# Patient Record
Sex: Male | Born: 1990 | Hispanic: No | Marital: Single | Smoking: Light tobacco smoker
Health system: Southern US, Community
[De-identification: ages and names within clinical notes are randomized; demographics above are authoritative.]

## PROBLEM LIST (undated history)

## (undated) DIAGNOSIS — F419 Anxiety disorder, unspecified: Secondary | ICD-10-CM

## (undated) DIAGNOSIS — F1120 Opioid dependence, uncomplicated: Secondary | ICD-10-CM

## (undated) DIAGNOSIS — G4700 Insomnia, unspecified: Secondary | ICD-10-CM

---
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
## 2010-11-03 ENCOUNTER — Emergency Department (HOSPITAL_BASED_OUTPATIENT_CLINIC_OR_DEPARTMENT_OTHER)
Admission: EM | Admit: 2010-11-03 | Discharge: 2010-11-03 | Disposition: A | Discharge status: Home / Self Care | Payer: Worker's Compensation | Attending: Emergency Medicine | Admitting: Emergency Medicine

## 2010-11-03 DIAGNOSIS — W208XXA Other cause of strike by thrown, projected or falling object, initial encounter: Secondary | ICD-10-CM | POA: Insufficient documentation

## 2010-11-03 DIAGNOSIS — G8929 Other chronic pain: Secondary | ICD-10-CM | POA: Insufficient documentation

## 2010-11-03 DIAGNOSIS — M542 Cervicalgia: Secondary | ICD-10-CM | POA: Insufficient documentation

## 2010-11-03 DIAGNOSIS — R51 Headache: Secondary | ICD-10-CM | POA: Insufficient documentation

## 2010-11-03 DIAGNOSIS — F319 Bipolar disorder, unspecified: Secondary | ICD-10-CM | POA: Insufficient documentation

## 2010-11-03 DIAGNOSIS — S0003XA Contusion of scalp, initial encounter: Secondary | ICD-10-CM | POA: Insufficient documentation

## 2010-11-08 ENCOUNTER — Emergency Department (HOSPITAL_BASED_OUTPATIENT_CLINIC_OR_DEPARTMENT_OTHER)
Admission: EM | Admit: 2010-11-08 | Discharge: 2010-11-09 | Disposition: A | Discharge status: Home / Self Care | Payer: PRIVATE HEALTH INSURANCE | Attending: Emergency Medicine | Admitting: Emergency Medicine

## 2010-11-08 DIAGNOSIS — F410 Panic disorder [episodic paroxysmal anxiety] without agoraphobia: Secondary | ICD-10-CM | POA: Insufficient documentation

## 2010-11-08 DIAGNOSIS — R071 Chest pain on breathing: Secondary | ICD-10-CM | POA: Insufficient documentation

## 2010-11-08 DIAGNOSIS — F319 Bipolar disorder, unspecified: Secondary | ICD-10-CM | POA: Insufficient documentation

## 2010-11-08 DIAGNOSIS — Z79899 Other long term (current) drug therapy: Secondary | ICD-10-CM | POA: Insufficient documentation

## 2010-11-08 DIAGNOSIS — T50995A Adverse effect of other drugs, medicaments and biological substances, initial encounter: Secondary | ICD-10-CM | POA: Insufficient documentation

## 2010-11-08 DIAGNOSIS — G8929 Other chronic pain: Secondary | ICD-10-CM | POA: Insufficient documentation

## 2010-11-09 ENCOUNTER — Emergency Department (INDEPENDENT_AMBULATORY_CARE_PROVIDER_SITE_OTHER): Payer: PRIVATE HEALTH INSURANCE

## 2010-11-09 DIAGNOSIS — M545 Low back pain: Secondary | ICD-10-CM

## 2010-11-09 DIAGNOSIS — R079 Chest pain, unspecified: Secondary | ICD-10-CM

## 2010-11-09 DIAGNOSIS — R404 Transient alteration of awareness: Secondary | ICD-10-CM

## 2010-11-09 LAB — DIFFERENTIAL
Basophils Absolute: 0 10*3/uL (ref 0.0–0.1)
Basophils Relative: 1 % (ref 0–1)
Eosinophils Absolute: 0.1 10*3/uL (ref 0.0–0.7)
Eosinophils Relative: 1 % (ref 0–5)
Lymphocytes Relative: 47 % — ABNORMAL HIGH (ref 12–46)
Lymphs Abs: 3.1 10*3/uL (ref 0.7–4.0)
Monocytes Absolute: 0.7 10*3/uL (ref 0.1–1.0)
Monocytes Relative: 11 % (ref 3–12)
Neutro Abs: 2.6 10*3/uL (ref 1.7–7.7)
Neutrophils Relative %: 40 % — ABNORMAL LOW (ref 43–77)

## 2010-11-09 LAB — URINALYSIS, ROUTINE W REFLEX MICROSCOPIC
Glucose, UA: NEGATIVE mg/dL
Leukocytes, UA: NEGATIVE
Protein, ur: NEGATIVE mg/dL
Specific Gravity, Urine: 1.016 (ref 1.005–1.030)
Urobilinogen, UA: 0.2 mg/dL (ref 0.0–1.0)

## 2010-11-09 LAB — RAPID URINE DRUG SCREEN, HOSP PERFORMED: Opiates: NOT DETECTED

## 2010-11-09 LAB — COMPREHENSIVE METABOLIC PANEL
Albumin: 4.4 g/dL (ref 3.5–5.2)
BUN: 15 mg/dL (ref 6–23)
Calcium: 10 mg/dL (ref 8.4–10.5)
Chloride: 103 mEq/L (ref 96–112)
Creatinine, Ser: 0.7 mg/dL (ref 0.50–1.35)
GFR calc non Af Amer: >60 mL/min (ref >60)
Total Bilirubin: 0.2 mg/dL — ABNORMAL LOW (ref 0.3–1.2)

## 2010-11-09 LAB — CBC
HCT: 39.9 % (ref 39.0–52.0)
MCH: 31.2 pg (ref 26.0–34.0)
MCV: 89.5 fL (ref 78.0–100.0)
RDW: 12.4 % (ref 11.5–15.5)
WBC: 6.5 10*3/uL (ref 4.0–10.5)

## 2011-09-02 ENCOUNTER — Ambulatory Visit
Admission: RE | Admit: 2011-09-02 | Discharge: 2011-09-02 | Disposition: A | Discharge status: Home / Self Care | Payer: 59 | Source: Ambulatory Visit | Attending: Orthopedic Surgery | Admitting: Orthopedic Surgery

## 2011-09-02 ENCOUNTER — Other Ambulatory Visit: Payer: Self-pay | Admitting: Orthopedic Surgery

## 2011-09-02 DIAGNOSIS — M549 Dorsalgia, unspecified: Secondary | ICD-10-CM

## 2012-12-20 ENCOUNTER — Emergency Department
Admission: EM | Admit: 2012-12-20 | Discharge: 2012-12-20 | Disposition: A | Discharge status: Home / Self Care | Payer: BC Managed Care – PPO | Source: Home / Self Care | Attending: Family Medicine | Admitting: Family Medicine

## 2012-12-20 ENCOUNTER — Encounter: Payer: Self-pay | Admitting: *Deleted

## 2012-12-20 DIAGNOSIS — J069 Acute upper respiratory infection, unspecified: Secondary | ICD-10-CM

## 2012-12-20 HISTORY — DX: Insomnia, unspecified: G47.00

## 2012-12-20 HISTORY — DX: Anxiety disorder, unspecified: F41.9

## 2012-12-20 LAB — POCT RAPID STREP A (OFFICE): Rapid Strep A Screen: NEGATIVE
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
## 2013-02-09 IMAGING — CR DG LUMBAR SPINE 2-3V
2 series · 2 of 2 positions shown · non-contrast
Comparison: 05/26/2008

CLINICAL DATA: Mid back pain

LUMBAR SPINE - 2-3 VIEW

[view not recorded (1 of 2)]
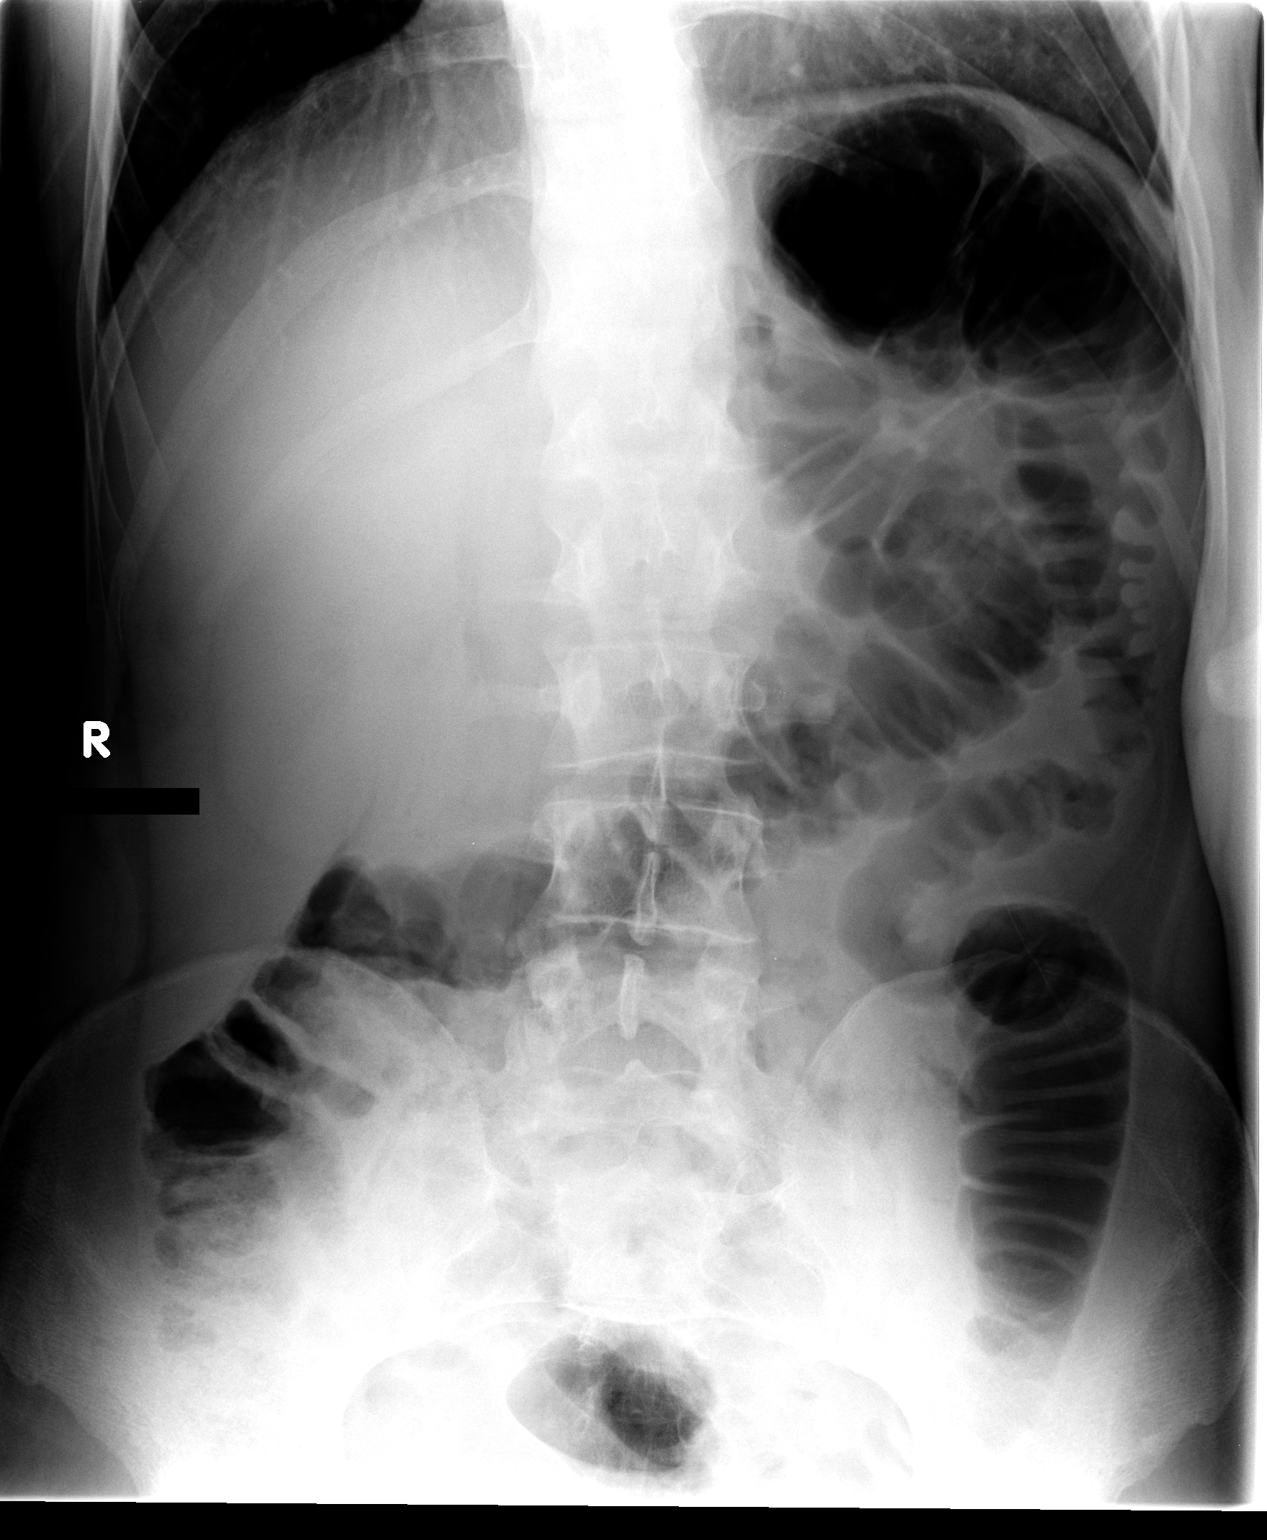

[view not recorded (2 of 2)]
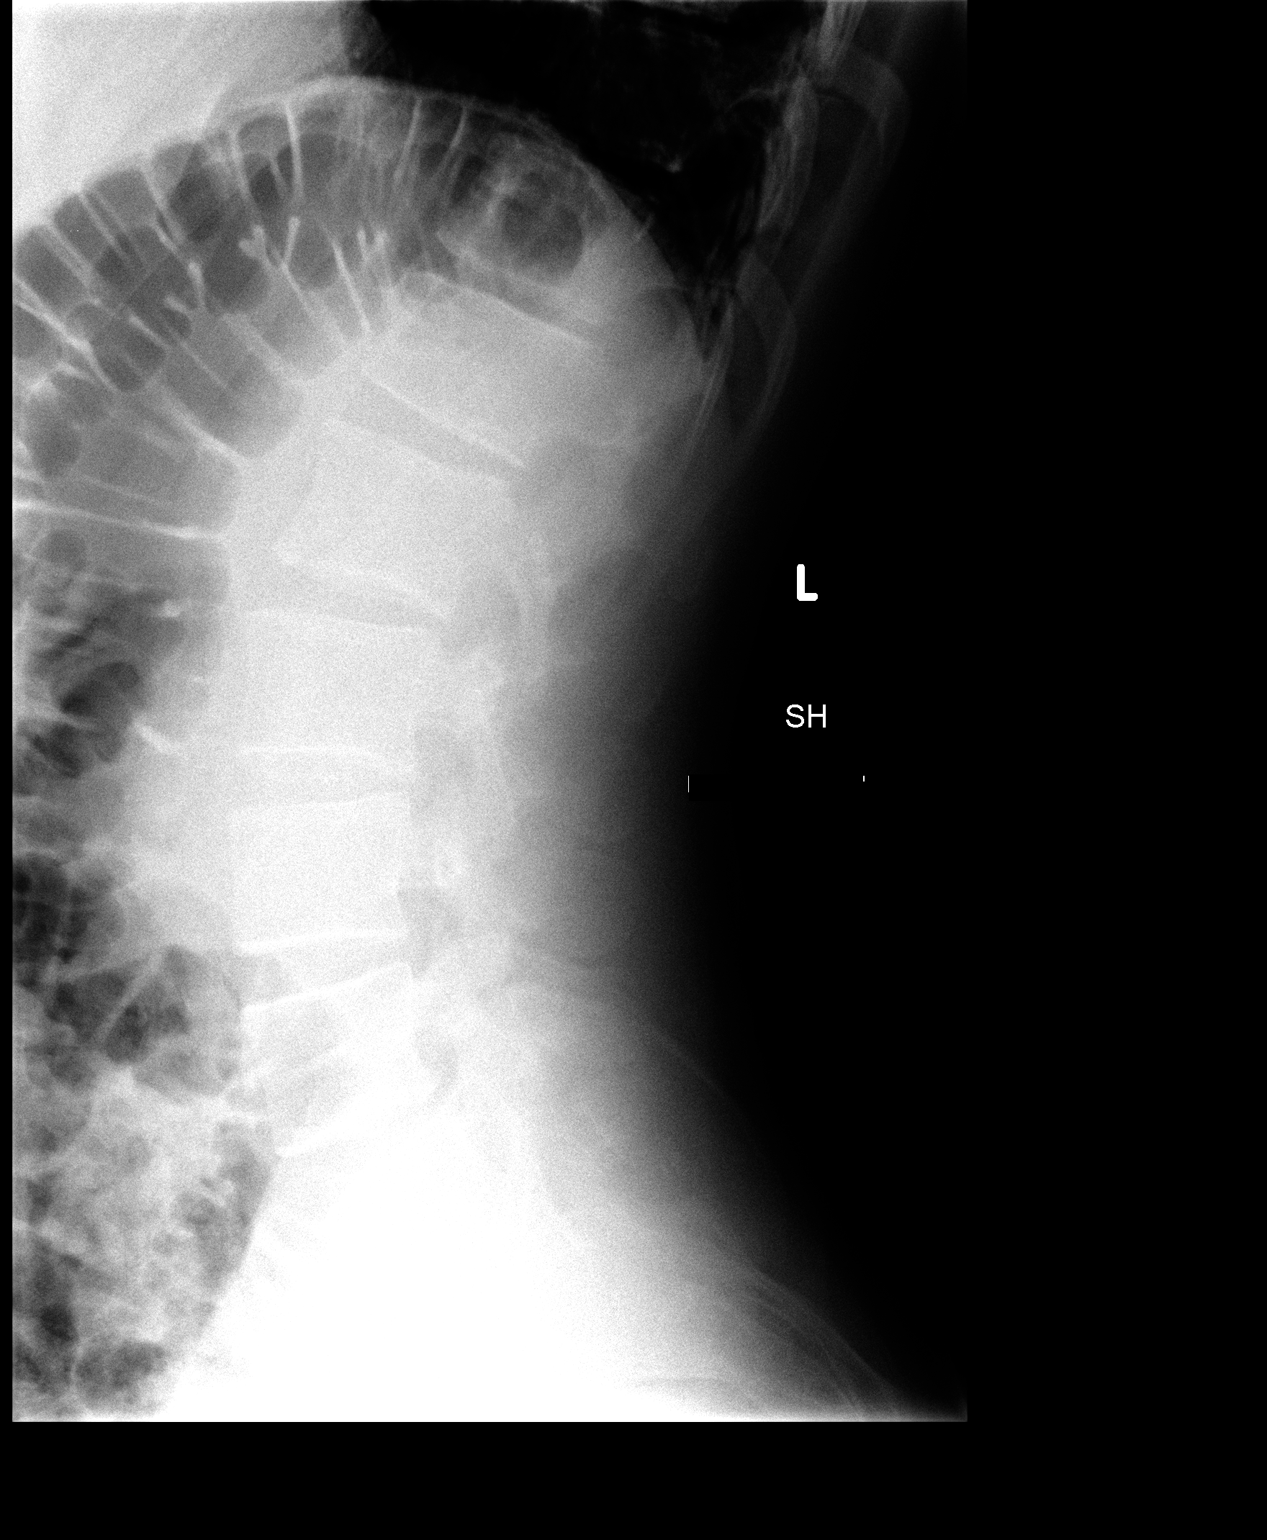

[2 of 2 positions shown; findings below may reference images not displayed]

FINDINGS: Hepatomegaly is suggested, liver tip projecting below the
iliac crest. There is no evidence of lumbar spine fracture.
Alignment is normal.  Intervertebral disc spaces are maintained.
IMPRESSION: 1.  Negative lumbar spine.
2.  Question hepatomegaly.

## 2013-02-09 IMAGING — CR DG THORACIC SPINE 2V
3 series · 3 of 3 positions shown · non-contrast
Comparison: 11/09/2010

CLINICAL DATA: Mid back pain

THORACIC SPINE - 2 VIEW

[view not recorded (1 of 3)]
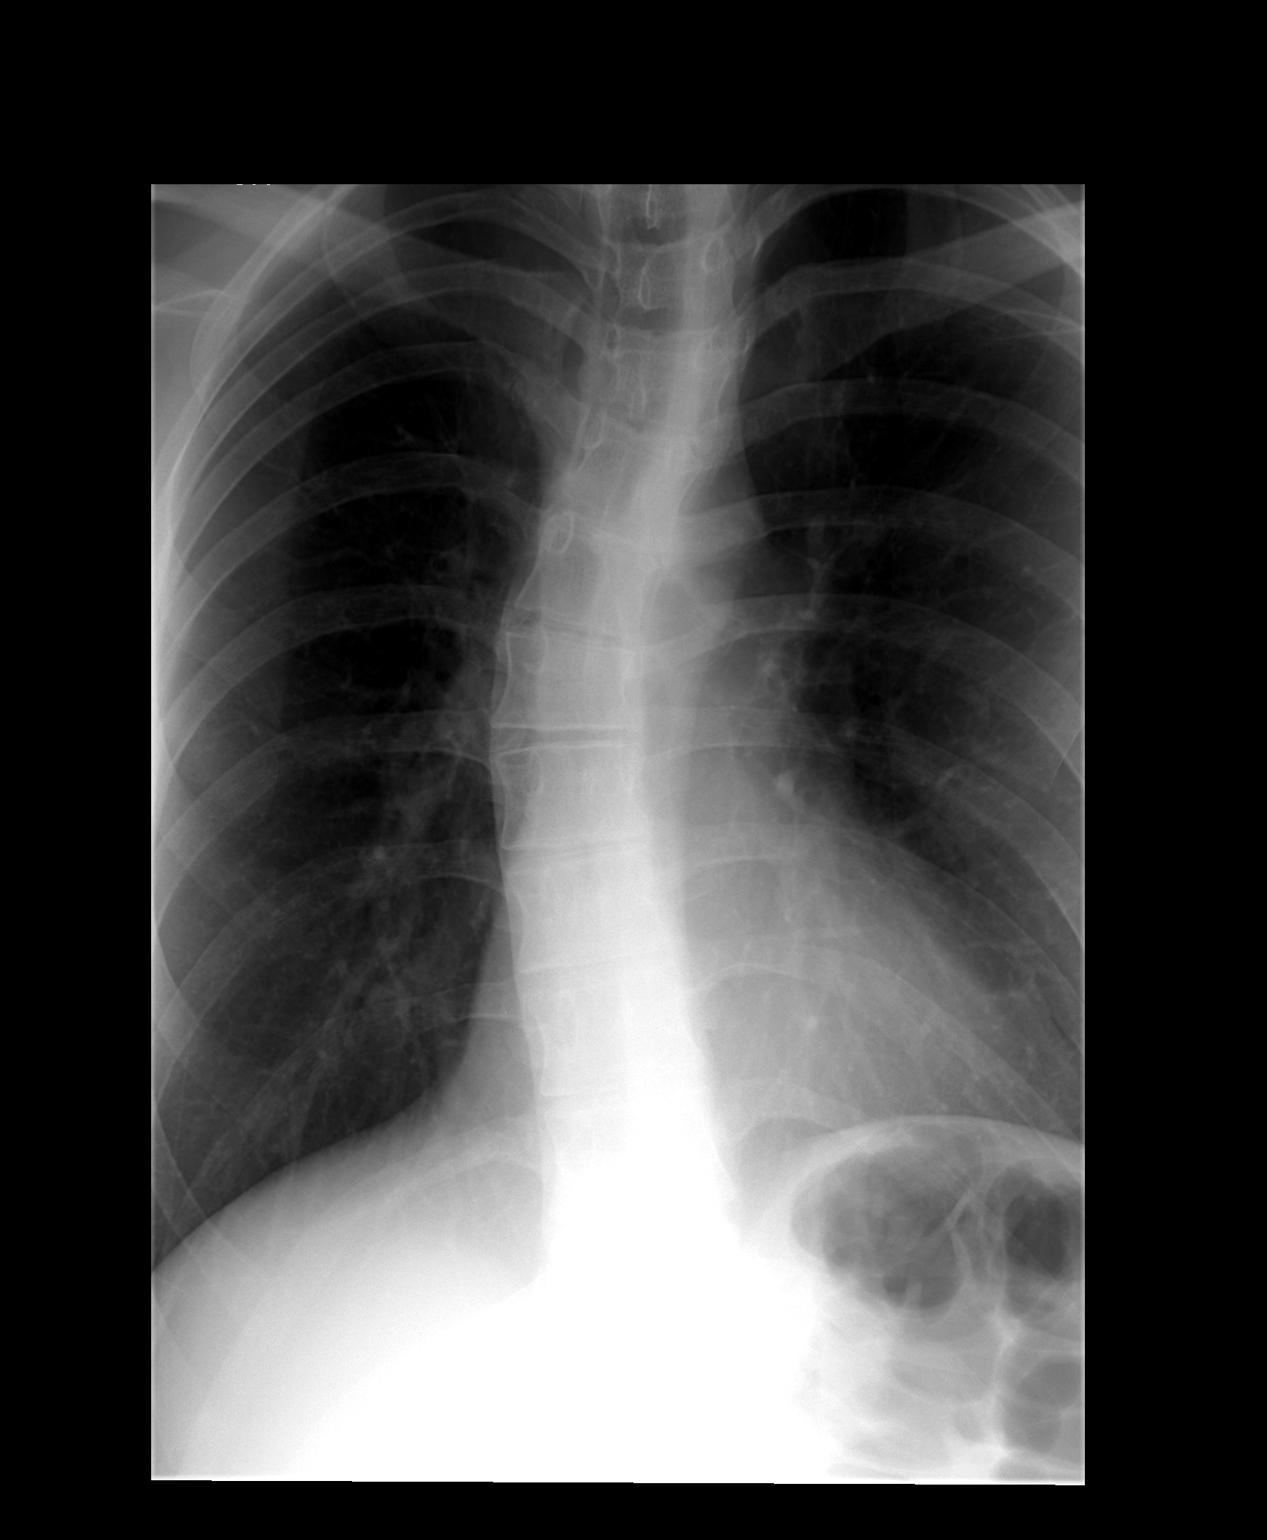

[view not recorded (2 of 3)]
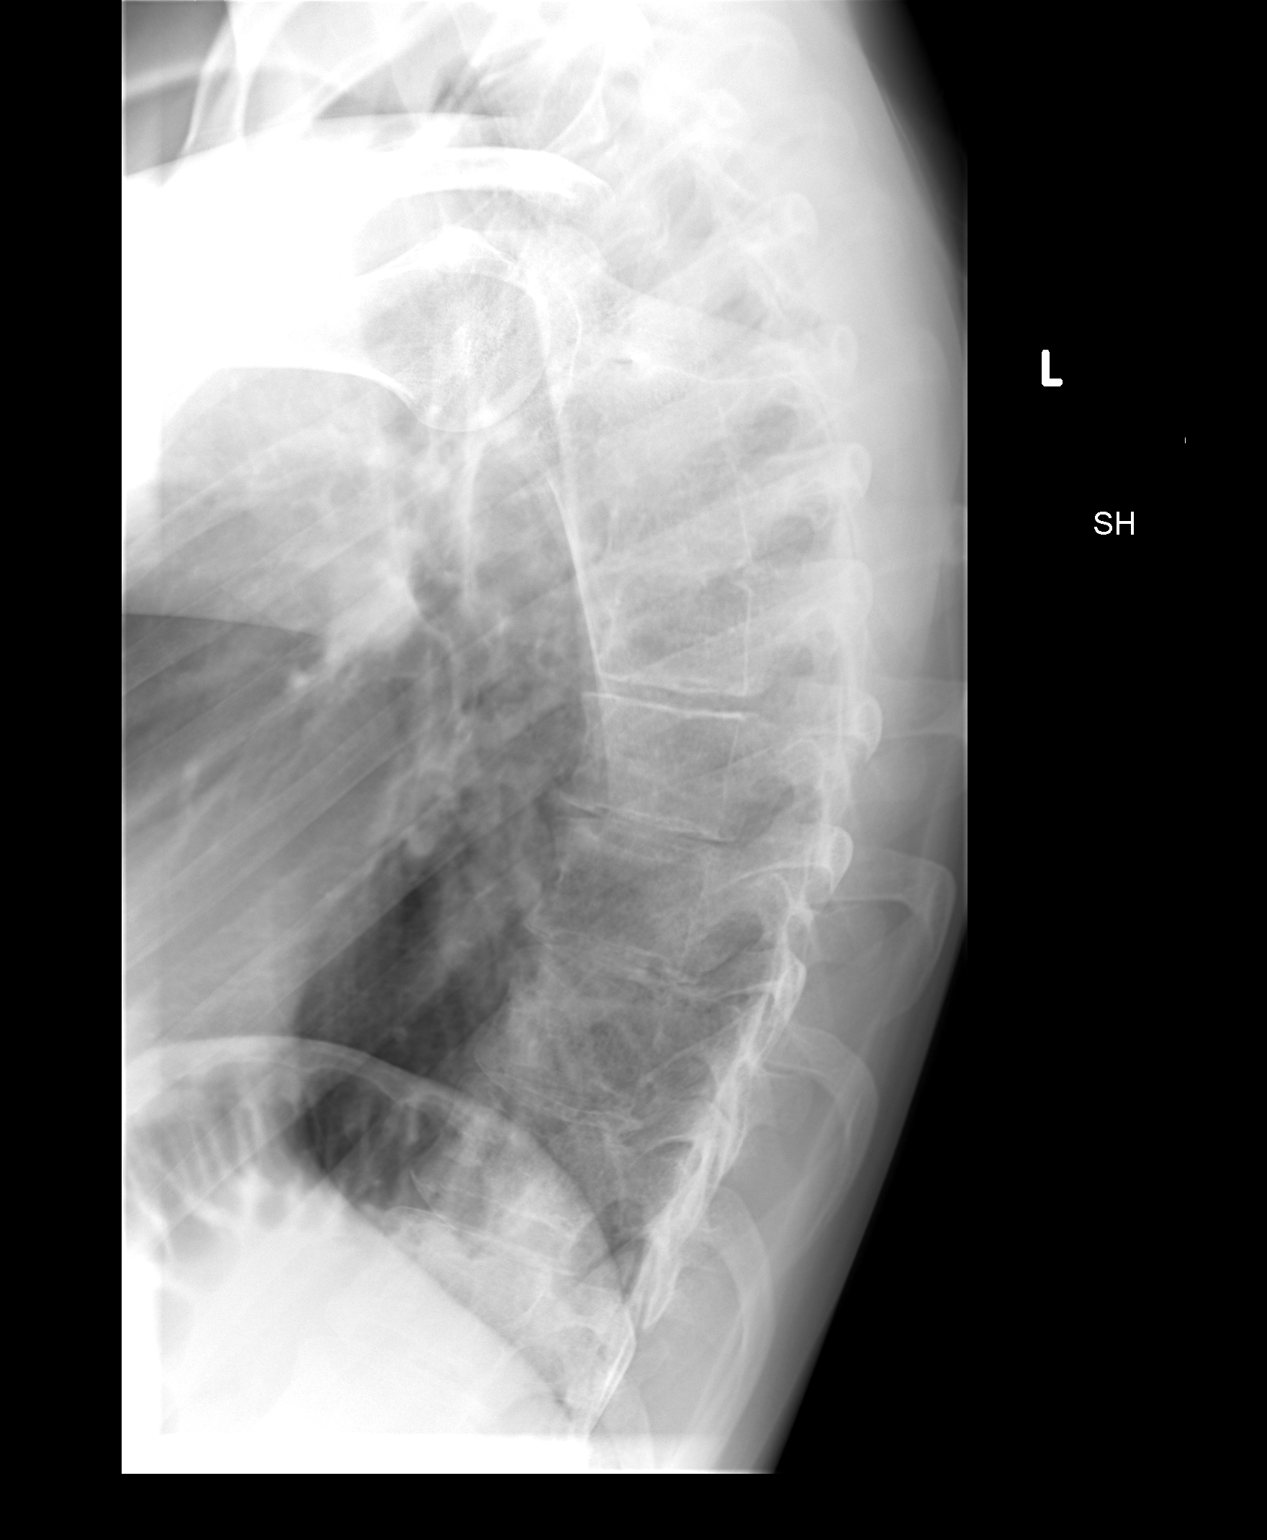

[view not recorded (3 of 3)]
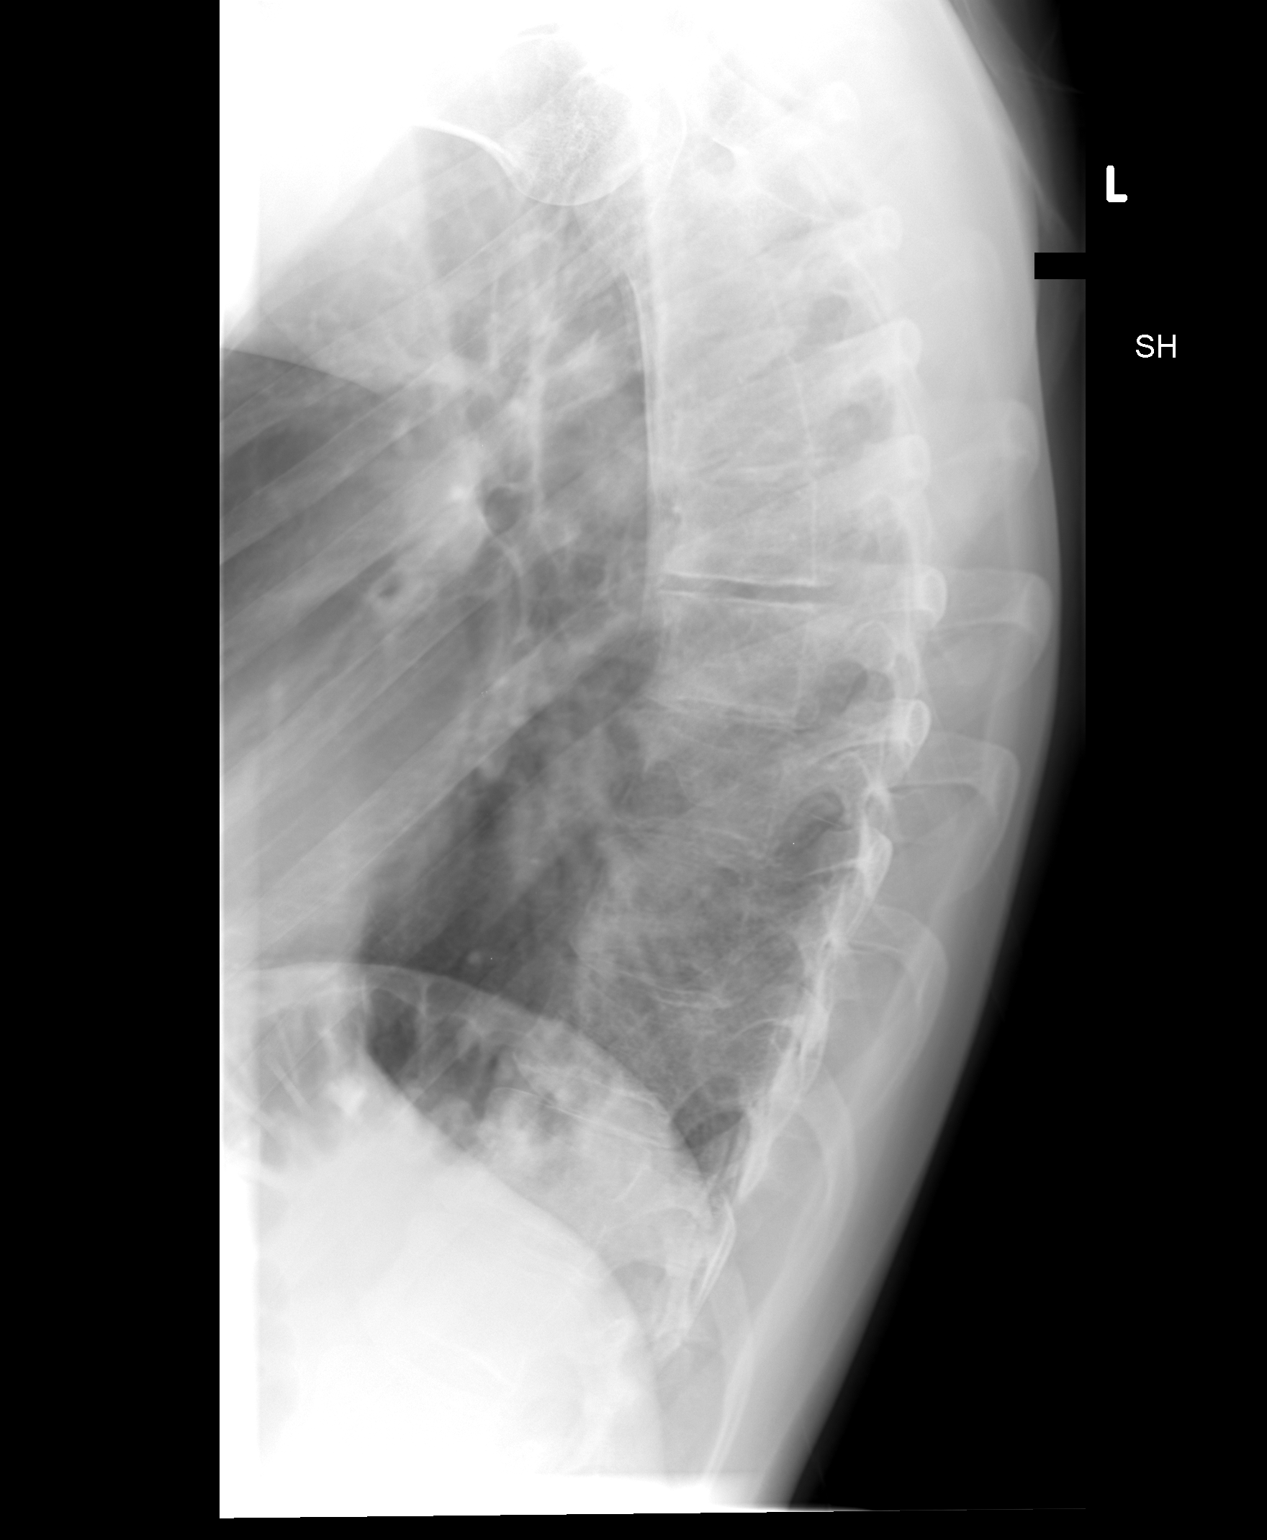

[3 of 3 positions shown; findings below may reference images not displayed]

FINDINGS: Stable mild S-shaped scoliosis of the thoracic spine
without evident underlying vertebral anomaly.  Negative for
fracture or other acute bony abnormality.
IMPRESSION: 1.  Stable thoracic scoliosis without acute or   superimposed
abnormality.

## 2013-06-18 ENCOUNTER — Emergency Department (HOSPITAL_COMMUNITY)
Admission: EM | Admit: 2013-06-18 | Discharge: 2013-06-19 | Disposition: A | Discharge status: Home / Self Care | Payer: 59 | Attending: Emergency Medicine | Admitting: Emergency Medicine

## 2013-06-18 ENCOUNTER — Encounter (HOSPITAL_COMMUNITY): Payer: Self-pay | Admitting: Emergency Medicine

## 2013-06-18 DIAGNOSIS — F1120 Opioid dependence, uncomplicated: Secondary | ICD-10-CM | POA: Insufficient documentation

## 2013-06-18 DIAGNOSIS — Z79899 Other long term (current) drug therapy: Secondary | ICD-10-CM | POA: Insufficient documentation

## 2013-06-18 DIAGNOSIS — F1310 Sedative, hypnotic or anxiolytic abuse, uncomplicated: Secondary | ICD-10-CM | POA: Insufficient documentation

## 2013-06-18 DIAGNOSIS — Z880 Allergy status to penicillin: Secondary | ICD-10-CM | POA: Insufficient documentation

## 2013-06-18 DIAGNOSIS — F411 Generalized anxiety disorder: Secondary | ICD-10-CM | POA: Insufficient documentation

## 2013-06-18 DIAGNOSIS — F419 Anxiety disorder, unspecified: Secondary | ICD-10-CM

## 2013-06-18 DIAGNOSIS — G4700 Insomnia, unspecified: Secondary | ICD-10-CM | POA: Insufficient documentation

## 2013-06-18 NOTE — ED Notes (Signed)
Pt states he is here detox from heroin and opiates, pt states he has not used since Monday. Normal usage 2 x week for 7-8 months.

## 2013-06-18 NOTE — BH Assessment (Signed)
Received call for assessment. Spoke with Gregory Severinlga Otter, MD who said Pt is requesting treatment for heroin dependence. He has not used since Monday and is reporting passive SI due to withdrawal symptoms. Assessment will be initiated.  Harlin RainFord Ellis Ria CommentWarrick Jr, LPC, [facility] Triage Specialist
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
## 2013-06-19 DIAGNOSIS — F1120 Opioid dependence, uncomplicated: Secondary | ICD-10-CM

## 2013-06-19 LAB — CBC
HCT: 42.3 % (ref 39.0–52.0)
Hemoglobin: 14.7 g/dL (ref 13.0–17.0)
MCH: 31.6 pg (ref 26.0–34.0)
MCHC: 34.8 g/dL (ref 30.0–36.0)
MCV: 91 fL (ref 78.0–100.0)
Platelets: 249 K/uL (ref 150–400)
RBC: 4.65 MIL/uL (ref 4.22–5.81)
RDW: 13.6 % (ref 11.5–15.5)
WBC: 9.8 K/uL (ref 4.0–10.5)

## 2013-06-19 LAB — SALICYLATE LEVEL

## 2013-06-19 LAB — COMPREHENSIVE METABOLIC PANEL WITH GFR
ALT: 27 U/L (ref 0–53)
AST: 18 U/L (ref 0–37)
Albumin: 4.5 g/dL (ref 3.5–5.2)
Alkaline Phosphatase: 70 U/L (ref 39–117)
BUN: 7 mg/dL (ref 6–23)
CO2: 24 meq/L (ref 19–32)
Calcium: 9.5 mg/dL (ref 8.4–10.5)
Chloride: 101 meq/L (ref 96–112)
Creatinine, Ser: 0.69 mg/dL (ref 0.50–1.35)
GFR calc Af Amer: >90 mL/min
GFR calc non Af Amer: >90 mL/min
Glucose, Bld: 109 mg/dL — ABNORMAL HIGH (ref 70–99)
Potassium: 4.2 meq/L (ref 3.7–5.3)
Sodium: 138 meq/L (ref 137–147)
Total Bilirubin: 0.3 mg/dL (ref 0.3–1.2)
Total Protein: 7.5 g/dL (ref 6.0–8.3)

## 2013-06-19 LAB — RAPID URINE DRUG SCREEN, HOSP PERFORMED
AMPHETAMINES: NOT DETECTED
BENZODIAZEPINES: POSITIVE — AB
Barbiturates: POSITIVE — AB
Cocaine: NOT DETECTED
OPIATES: NOT DETECTED
Tetrahydrocannabinol: NOT DETECTED

## 2013-06-19 LAB — ETHANOL

## 2013-06-19 LAB — ACETAMINOPHEN LEVEL: Acetaminophen (Tylenol), Serum: <15 ug/mL (ref 10–30)

## 2013-06-19 MED ORDER — ZOLPIDEM TARTRATE 5 MG PO TABS: 5.0000 mg | ORAL_TABLET | Freq: Every evening | ORAL | Status: DC | PRN

## 2013-06-19 MED ORDER — ACETAMINOPHEN 325 MG PO TABS
650.0000 mg | ORAL_TABLET | ORAL | Status: DC | PRN
  Administered 2013-06-19: 650 mg via ORAL
  Filled 2013-06-19: qty 2

## 2013-06-19 MED ORDER — ONDANSETRON HCL 4 MG PO TABS: 4.0000 mg | ORAL_TABLET | Freq: Three times a day (TID) | ORAL | Status: DC | PRN

## 2013-06-19 MED ORDER — LORAZEPAM 1 MG PO TABS
1.0000 mg | ORAL_TABLET | Freq: Three times a day (TID) | ORAL | Status: DC | PRN
  Administered 2013-06-19 (×2): 1 mg via ORAL
  Filled 2013-06-19 (×2): qty 1

## 2013-06-19 NOTE — [facility] Suicide Risk Assessment (Cosign Needed)
Suicide Risk Assessment  Discharge Assessment     Demographic Factors:  Adolescent or young adult and Caucasian  Total Time spent with patient: 15 minutes  Psychiatric Specialty Exam:     Blood pressure 127/67, pulse 87, temperature 97.6 F (36.4 C), temperature source Oral, resp. rate 18, weight 68.04 kg (150 lb), SpO2 99.00%.Body mass index is 20.34 kg/(m^2).  General Appearance: Casual and Fairly Groomed  Patent attorneyye Contact::  Good  Speech:  Clear and Coherent and Normal Rate  Volume:  Normal  Mood:  Anxious  Affect:  Appropriate and Flat  Thought Process:  Coherent and Goal Directed  Orientation:  Full (Time, Place, and Person)  Thought Content:  NA  Suicidal Thoughts:  No  Homicidal Thoughts:  No  Memory:  Immediate;   Good Recent;   Good Remote;   Good  Judgement:  Fair  Insight:  Fair  Psychomotor Activity:  Normal  Concentration:  Good  Recall:  NA  Fund of Knowledge:Good  Language: Good  Akathisia:  NA  Handed:  Right  AIMS (if indicated):     Assets:  Desire for Improvement  Sleep:       Musculoskeletal: Strength & Muscle Tone: within normal limits Gait & Station: normal Patient leans: N/A   Mental Status Per Nursing Assessment::   On Admission:     Current Mental Status by Physician: NA  Loss Factors: NA  Historical Factors: NA  Risk Reduction Factors:   Sense of responsibility to family, Employed, Living with another person, especially a relative and Positive social support  Continued Clinical Symptoms:  Severe Anxiety and/or Agitation  Cognitive Features That Contribute To Risk:  Polarized thinking    Suicide Risk:  Minimal: No identifiable suicidal ideation.  Patients presenting with no risk factors but with morbid ruminations; may be classified as minimal risk based on the severity of the depressive symptoms  Discharge Diagnoses:   AXIS I:  Opioid dependence, anxiety d/o AXIS II:  Deferred AXIS III:   Past Medical History   Diagnosis Date  . Anxiety   . Insomnia    AXIS IV:  other psychosocial or environmental problems and problems related to social environment AXIS V:  61-70 mild symptoms  Plan Of Care/Follow-up recommendations:  Activity:  As tolerated Diet:  Regular  Is patient on multiple antipsychotic therapies at discharge:  No   Has Patient had three or more failed trials of antipsychotic monotherapy by history:  No  Recommended Plan for Multiple Antipsychotic Therapies: NA    Buckley Bradly, C   PMHNP-BC 06/19/2013, 3:41 PM
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
## 2013-06-19 NOTE — ED Notes (Addendum)
Pt. Has been waned. Family has belonging.

## 2013-06-19 NOTE — Progress Notes (Signed)
Writer informed the ER MD (Dr. Norlene Campbelltter) that the patient will be pending a psych disposition in the morning.  The [facility] will refer the patient to other facilities for detox.

## 2013-06-19 NOTE — ED Notes (Signed)
D/C instructions reviewed. No new prescriptions given. No belongings here, sent come earlier with family. Ambulatory without difficulty. Denies pain. No complaints voiced. Denies SI/HI and A/V hallucinations.

## 2013-06-19 NOTE — BH Assessment (Signed)
Gregory Freeman with TTS will assess Pt face-to-face due to poor internet connectivity in triage.  Harlin RainFord Ellis Ria CommentWarrick Jr, LPC, [facility] Triage Specialist

## 2013-06-19 NOTE — Consult Note (Addendum)
[facility] Face-to-Face Psychiatry Consult   Reason for Consult:  Opoid Dependence Referring Physician:  EDP Gregory Freeman is an 23 y.o. male. Total Time spent with patient: 45 minutes  Assessment: AXIS I:  Opoid Dependence AXIS II:  Deferred AXIS III:   Past Medical History  Diagnosis Date  . Anxiety   . Insomnia    AXIS IV:  other psychosocial or environmental problems and problems related to social environment AXIS V:  61-70 mild symptoms  Plan:  No evidence of imminent risk to self or others at present.    Subjective:   Gregory Freeman is a 23 y.o. male patient evaluated for Opoid dependence.  HPI:  Patient is a 23 year old white male that lives with his parents. Patient reports that he is employed at the [employer] and he denies any criminal charges.  Patient requests detox from heroin and opiates. Patient reports that he has not used since Monday. However, his UDS is positive for benzodiazepines and barbiturates.  Patient is taking Valium for severe anxiety and has been on it for 6 years.  He does not consider his Benzo use a problem because he does not abuse it.  Patient BAL is <11. Patient reports that he has been using Oxycodone and occasional Heroin  for 7-8 months. Patient reports that he uses at least two times weekly. Patient reports that he uses 3m. Patient reports that he snorts the drug and does not use IV. Patient denies previous detox treatment. Patient denies seizures. Patient reports withdrawl symptoms usually to include weakness, irritability, cramps, chills and agitation. Patient denies withdrawal symptoms at this time of evaluation. Patient reports SI without a plan. Patient denies prior psychiatric hospitalizations. Patient reports a previous diagnosis of Anxiety Disorder. Patient reports that he is compliant with taking his medication. Patient denies SI/HI/AVH.  Patient lives with his parents and he does not have any outpatient Psychiatric providers or therapist.   He is not a danger to himself or anybody else and he has agreed to do CDIOP ( Chemical Dependency intensive out patient Program)  We will discharge patient.  HPI Elements:   Location:  Opoiod dependence. Quality:  is severe, spending a lot of money and time obtaining Opiates. Duration:  7-8 months. Context:  Almost losing his job if he continues using.  Past Psychiatric History: Past Medical History  Diagnosis Date  . Anxiety   . Insomnia     reports that he has never smoked. He has never used smokeless tobacco. He reports that he uses illicit drugs. He reports that he does not drink alcohol. Family History  Problem Relation Age of Onset  . Cancer Other   . Diabetes Other    Family History Substance Abuse: Yes, Describe: Family Supports: No Living Arrangements: Parent Can pt return to current living arrangement?: Yes   Allergies:   Allergies  Allergen Reactions  . Penicillins   . Serotonin Reuptake Inhibitors (Ssris)     ACT Assessment Complete:  Yes:    Educational Status    Risk to Self: Risk to self Suicidal Ideation: Yes-Currently Present Suicidal Intent: No Is patient at risk for suicide?: No Suicidal Plan?: No Access to Means: No What has been your use of drugs/alcohol within the last 12 months?: Opiates Previous Attempts/Gestures: No How many times?: 0 Other Self Harm Risks: None  Triggers for Past Attempts: Unpredictable Intentional Self Injurious Behavior:  (Previous history of cutting 6 years ago.) Family Suicide History: No Recent stressful life event(s): Other (Comment) Persecutory  voices/beliefs?: No Depression: Yes Depression Symptoms: Insomnia;Feeling angry/irritable;Feeling worthless/self pity;Guilt Substance abuse history and/or treatment for substance abuse?: Yes (UDS positive for benzos (has prescription) and barbituates     BAL <11) Suicide prevention information given to non-admitted patients: Yes  Risk to Others: Risk to Others Homicidal  Ideation: No Thoughts of Harm to Others: No Current Homicidal Intent: No Current Homicidal Plan: No Access to Homicidal Means: No Identified Victim: None Reported History of harm to others?: No Assessment of Violence: None Noted Violent Behavior Description: Arguments with relatives in the past  Does patient have access to weapons?: No Criminal Charges Pending?: No Does patient have a court date: No  Abuse:    Prior Inpatient Therapy: Prior Inpatient Therapy Prior Inpatient Therapy: No Prior Therapy Dates: NA Prior Therapy Facilty/Provider(s): NA Reason for Treatment: NA  Prior Outpatient Therapy: Prior Outpatient Therapy Prior Outpatient Therapy: Yes Prior Therapy Dates: Ongoing  Prior Therapy Facilty/Provider(s): Dr. Casimiro Needle Reason for Treatment: Medication Management   Additional Information: Additional Information 1:1 In Past 12 Months?: No CIRT Risk: No Elopement Risk: No Does patient have medical clearance?: Yes                  Objective: Blood pressure 127/67, pulse 87, temperature 97.6 F (36.4 C), temperature source Oral, resp. rate 18, weight 68.04 kg (150 lb), SpO2 99.00%.Body mass index is 20.34 kg/(m^2). Results for orders placed during the hospital encounter of 06/18/13 (from the past 72 hour(s))  ACETAMINOPHEN LEVEL     Status: None   Collection Time    06/18/13 11:42 PM      Result Value Range   Acetaminophen (Tylenol), Serum <15.0  10 - 30 ug/mL   Comment:            THERAPEUTIC CONCENTRATIONS VARY     SIGNIFICANTLY. A RANGE OF 10-30     ug/mL MAY BE AN EFFECTIVE     CONCENTRATION FOR MANY PATIENTS.     HOWEVER, SOME ARE BEST TREATED     AT CONCENTRATIONS OUTSIDE THIS     RANGE.     ACETAMINOPHEN CONCENTRATIONS     >150 ug/mL AT 4 HOURS AFTER     INGESTION AND >50 ug/mL AT 12     HOURS AFTER INGESTION ARE     OFTEN ASSOCIATED WITH TOXIC     REACTIONS.  CBC     Status: None   Collection Time    06/18/13 11:42 PM      Result Value  Range   WBC 9.8  4.0 - 10.5 K/uL   RBC 4.65  4.22 - 5.81 MIL/uL   Hemoglobin 14.7  13.0 - 17.0 g/dL   HCT 42.3  39.0 - 52.0 %   MCV 91.0  78.0 - 100.0 fL   MCH 31.6  26.0 - 34.0 pg   MCHC 34.8  30.0 - 36.0 g/dL   RDW 13.6  11.5 - 15.5 %   Platelets 249  150 - 400 K/uL  COMPREHENSIVE METABOLIC PANEL     Status: Abnormal   Collection Time    06/18/13 11:42 PM      Result Value Range   Sodium 138  137 - 147 mEq/L   Potassium 4.2  3.7 - 5.3 mEq/L   Chloride 101  96 - 112 mEq/L   CO2 24  19 - 32 mEq/L   Glucose, Bld 109 (*) 70 - 99 mg/dL   BUN 7  6 - 23 mg/dL   Creatinine, Ser 0.69  0.50 -  1.35 mg/dL   Calcium 9.5  8.4 - 10.5 mg/dL   Total Protein 7.5  6.0 - 8.3 g/dL   Albumin 4.5  3.5 - 5.2 g/dL   AST 18  0 - 37 U/L   ALT 27  0 - 53 U/L   Alkaline Phosphatase 70  39 - 117 U/L   Total Bilirubin 0.3  0.3 - 1.2 mg/dL   GFR calc non Af Amer >90  >90 mL/min   GFR calc Af Amer >90  >90 mL/min   Comment: (NOTE)     The eGFR has been calculated using the CKD EPI equation.     This calculation has not been validated in all clinical situations.     eGFR's persistently <90 mL/min signify possible Chronic Kidney     Disease.  ETHANOL     Status: None   Collection Time    06/18/13 11:42 PM      Result Value Range   Alcohol, Ethyl (B) <11  0 - 11 mg/dL   Comment:            LOWEST DETECTABLE LIMIT FOR     SERUM ALCOHOL IS 11 mg/dL     FOR MEDICAL PURPOSES ONLY  SALICYLATE LEVEL     Status: Abnormal   Collection Time    06/18/13 11:42 PM      Result Value Range   Salicylate Lvl <1.0 (*) 2.8 - 20.0 mg/dL  URINE RAPID DRUG SCREEN (HOSP PERFORMED)     Status: Abnormal   Collection Time    06/19/13 12:11 AM      Result Value Range   Opiates NONE DETECTED  NONE DETECTED   Cocaine NONE DETECTED  NONE DETECTED   Benzodiazepines POSITIVE (*) NONE DETECTED   Amphetamines NONE DETECTED  NONE DETECTED   Tetrahydrocannabinol NONE DETECTED  NONE DETECTED   Barbiturates POSITIVE (*) NONE  DETECTED   Comment:            DRUG SCREEN FOR MEDICAL PURPOSES     ONLY.  IF CONFIRMATION IS NEEDED     FOR ANY PURPOSE, NOTIFY LAB     WITHIN 5 DAYS.                LOWEST DETECTABLE LIMITS     FOR URINE DRUG SCREEN     Drug Class       Cutoff (ng/mL)     Amphetamine      1000     Barbiturate      200     Benzodiazepine   175     Tricyclics       102     Opiates          300     Cocaine          300     THC              50   Labs are reviewed and are pertinent for UDS is positive for Benzodiazepine and Barbiturates.  Current Facility-Administered Medications  Medication Dose Route Frequency Provider Last Rate Last Dose  . acetaminophen (TYLENOL) tablet 650 mg  650 mg Oral Q4H PRN Kalman Drape, MD   650 mg at 06/19/13 0818  . LORazepam (ATIVAN) tablet 1 mg  1 mg Oral Q8H PRN Kalman Drape, MD   1 mg at 06/19/13 1125  . ondansetron (ZOFRAN) tablet 4 mg  4 mg Oral Q8H PRN Kalman Drape, MD      .  zolpidem (AMBIEN) tablet 5 mg  5 mg Oral QHS PRN Kalman Drape, MD       Current Outpatient Prescriptions  Medication Sig Dispense Refill  . diazepam (VALIUM) 5 MG tablet Take 10 mg by mouth 3 (three) times daily.      Marland Kitchen gabapentin (NEURONTIN) 800 MG tablet Take 1,600-2,400 mg by mouth 2 (two) times daily. 2 in am 3 at bedtime      . ibuprofen (ADVIL,MOTRIN) 200 MG tablet Take 200 mg by mouth every 6 (six) hours as needed.      . zolpidem (AMBIEN) 10 MG tablet Take 10 mg by mouth at bedtime as needed for sleep.       Review of Physical examination performed by EDP on 2/6 reveals WNL examination. Psychiatric Specialty Exam:     Blood pressure 127/67, pulse 87, temperature 97.6 F (36.4 C), temperature source Oral, resp. rate 18, weight 68.04 kg (150 lb), SpO2 99.00%.Body mass index is 20.34 kg/(m^2).  General Appearance: Casual and Fairly Groomed  Engineer, water::  Good  Speech:  Clear and Coherent and Normal Rate  Volume:  Normal  Mood:  Anxious  Affect:  Appropriate  Thought Process:   Coherent and Goal Directed  Orientation:  Full (Time, Place, and Person)  Thought Content:  NA  Suicidal Thoughts:  No  Homicidal Thoughts:  No  Memory:  Immediate;   Good Recent;   Good Remote;   Good  Judgement:  Fair  Insight:  Good  Psychomotor Activity:  Normal  Concentration:  Good  Recall:  NA  Fund of Knowledge:Starts and completes sentences  Language: English is primary Language  Akathisia:  NA  Handed:  Right  AIMS (if indicated):     Assets:  Desire for Improvement  Sleep:      Musculoskeletal: Strength & Muscle Tone: within normal limits Gait & Station: normal Patient leans: N/A  Treatment Plan Summary:  Consult and face to face interview with Dr Oletta Darter We will discharge patient home We will refer patient to [place] Patient is in agreement with this plan of care Discharging patient back home  Charmaine Downs, C   PMHNP-BC 06/19/2013 3:16 PM  I have personally seen the patient and agreed with the findings and involved in the treatment plan. Berniece Andreas, MD

## 2013-06-19 NOTE — ED Notes (Signed)
This is a 23 years old Caucasian male admitted to the unit this morning. He endorsed having withdrawals from opiate and requested for opiate detox; although his lab was negative for opiate. He said he takes 1600 mg of Gabapentin in the morning and 2400 mg at bed time , with 10 mg Valium at bed time. He denied any history of seizures with withdrawals. Patient appeared very anxious, he rated his anxiety at 7 on a scale of 1-10 with 10 the worst. Writer offered patient 1 mg Ativan. Patient received the medication but said the dosage was not enough. He focused on getting more medications. Writer encouraged patient to discuss with the physician in the morning. He denied SI/HI and denied hallucinations. Answered minimal questions but kept going back to asking questions i=on how he could get more medications. Q 15 minute check initiated. Writer notified [facility] about this admission.

## 2013-07-05 ENCOUNTER — Emergency Department (HOSPITAL_COMMUNITY)
Admission: EM | Admit: 2013-07-05 | Discharge: 2013-07-06 | Disposition: A | Discharge status: Home / Self Care | Payer: 59 | Attending: Emergency Medicine | Admitting: Emergency Medicine

## 2013-07-05 DIAGNOSIS — Z880 Allergy status to penicillin: Secondary | ICD-10-CM | POA: Insufficient documentation

## 2013-07-05 DIAGNOSIS — F1310 Sedative, hypnotic or anxiolytic abuse, uncomplicated: Secondary | ICD-10-CM | POA: Insufficient documentation

## 2013-07-05 DIAGNOSIS — F411 Generalized anxiety disorder: Secondary | ICD-10-CM

## 2013-07-05 DIAGNOSIS — Z79899 Other long term (current) drug therapy: Secondary | ICD-10-CM | POA: Insufficient documentation

## 2013-07-05 DIAGNOSIS — G4700 Insomnia, unspecified: Secondary | ICD-10-CM | POA: Insufficient documentation

## 2013-07-05 MED ORDER — NICOTINE 21 MG/24HR TD PT24: 21.0000 mg | MEDICATED_PATCH | Freq: Every day | TRANSDERMAL | Status: DC

## 2013-07-05 MED ORDER — ZOLPIDEM TARTRATE 5 MG PO TABS: 5.0000 mg | ORAL_TABLET | Freq: Every evening | ORAL | Status: DC | PRN

## 2013-07-05 MED ORDER — IBUPROFEN 200 MG PO TABS: 600.0000 mg | ORAL_TABLET | Freq: Three times a day (TID) | ORAL | Status: DC | PRN

## 2013-07-05 MED ORDER — ONDANSETRON HCL 4 MG PO TABS: 4.0000 mg | ORAL_TABLET | Freq: Three times a day (TID) | ORAL | Status: DC | PRN

## 2013-07-05 NOTE — ED Provider Notes (Signed)
CSN: [id number]     Arrival date & time 07/05/13  2313 History  This chart was scribed for non-physician practitioner Earley Favor, NP working with Sunnie Nielsen, MD by Dorothey Baseman, ED Scribe. This patient was seen in room [facility] and the patient's care was started at 11:47 PM.    Chief Complaint  Patient presents with  . Medical Clearance   The history is provided by the patient and a relative (aunt). No language interpreter was used.   HPI Comments: Gregory Freeman is a 23 y.o. Male with a history of opiate addiction and anxiety who presents to the Emergency Department requesting medical clearance for anxiety. Patient was seen here on 06/18/2013 for similar complaints, but denies history of hospital admission for his anxiety. Patient states that his panic attacks have continued to increase in frequency, despite taking Valium at home regularly (he admits to taking 3-4 doses today). Patient reports that he has been prescribed Valium for the past 5 years, but that it is only effective at intermittently providing relief of his panic attacks. He states that he plans to call his psychologist tomorrow to make a follow up appointment. He states that he stopped using opiates a few weeks ago. His aunt reports that the patient has been acting aggressively towards his other family members and girlfriend over the past few days. His aunt reports that the patient's mother regulates/monitors his medications and noted that three pills of Restoril were missing, but patient denies taking them. He denies any other drug use. He denies suicidal ideations. Patient reports an allergy to SSRIs.   Patient is also complaining of some diffuse myalgias. He reports taking ibuprofen at home intermittently without significant relief, but denies taking any today.   Past Medical History  Diagnosis Date  . Anxiety   . Insomnia    History reviewed. No pertinent past surgical history. Family History  Problem Relation Age of Onset   . Cancer Other   . Diabetes Other    History  Substance Use Topics  . Smoking status: Never Smoker   . Smokeless tobacco: Never Used  . Alcohol Use: No    Review of Systems  Musculoskeletal: Positive for myalgias.  Psychiatric/Behavioral: Positive for behavioral problems. Negative for suicidal ideas. The patient is nervous/anxious.   All other systems reviewed and are negative.      Allergies  Penicillins and Serotonin reuptake inhibitors (ssris)  Home Medications   Current Outpatient Rx  Name  Route  Sig  Dispense  Refill  . diazepam (VALIUM) 5 MG tablet   Oral   Take 10 mg by mouth 3 (three) times daily.         Marland Kitchen gabapentin (NEURONTIN) 800 MG tablet   Oral   Take 1,600-2,400 mg by mouth 2 (two) times daily. 2 in am 3 at bedtime         . temazepam (RESTORIL) 15 MG capsule   Oral   Take 15 mg by mouth at bedtime as needed for sleep.         Marland Kitchen ibuprofen (ADVIL,MOTRIN) 200 MG tablet   Oral   Take 200 mg by mouth every 6 (six) hours as needed for mild pain.           Triage Vitals: BP 124/82  Pulse 95  Temp(Src) 98.1 F (36.7 C) (Oral)  Resp 16  Ht 5\' 11"  (1.803 m)  Wt 155 lb (70.308 kg)  BMI 21.63 kg/m2  SpO2 100%  Physical Exam  Nursing note  and vitals reviewed. Constitutional: He is oriented to person, place, and time. He appears well-developed and well-nourished. No distress.  HENT:  Head: Normocephalic and atraumatic.  Eyes: Conjunctivae are normal.  Neck: Normal range of motion. Neck supple.  Pulmonary/Chest: Effort normal. No respiratory distress.  Abdominal: He exhibits no distension.  Musculoskeletal: Normal range of motion.  Neurological: He is alert and oriented to person, place, and time.  Skin: Skin is warm and dry.  Psychiatric: He has a normal mood and affect. His behavior is normal.    ED Course  Procedures (including critical care time)  DIAGNOSTIC STUDIES: Oxygen Saturation is 100% on room air, normal by my  interpretation.    COORDINATION OF CARE: 11:56 PM- Will order blood labs and UA. Discussed treatment plan with patient at bedside and patient verbalized agreement.     Labs Review Labs Reviewed  CBC WITH DIFFERENTIAL - Abnormal; Notable for the following:    Monocytes Relative 13 (*)    All other components within normal limits  URINE RAPID DRUG SCREEN (HOSP PERFORMED) - Abnormal; Notable for the following:    Benzodiazepines POSITIVE (*)    All other components within normal limits  I-STAT CHEM 8, ED - Abnormal; Notable for the following:    Potassium 3.6 (*)    BUN 5 (*)    Calcium, Ion 1.24 (*)    All other components within normal limits  ETHANOL   Imaging Review No results found.  EKG Interpretation   None      reports that there are a number of Restoril tablets that are missing.  Patient denies taking these.  He, states his mother is in charge of drooling out of his medications.  He does, state that he took his a moderate number of Valiums today, one at breakfast, 2 at dinnertime with a fourth tablet late afternoon, for his extreme anxiety.  He denies suicidality or homicidality, states he will call his therapist in the morning to set an appointment, but does not actually have an appointment  MDM  Patient's labs have been reviewed.  He is medically cleared for psychiatric evaluation Final diagnoses:  Generalized anxiety disorder   I personally performed the services described in this documentation, which was scribed in my presence. The recorded information has been reviewed and is accurate.   Arman FilterGail K Earlean Fidalgo, NP 07/06/13 0050  Arman FilterGail K Kory Panjwani, NP 07/06/13 2053

## 2013-07-06 ENCOUNTER — Encounter (HOSPITAL_COMMUNITY): Payer: Self-pay | Admitting: Emergency Medicine

## 2013-07-06 DIAGNOSIS — F411 Generalized anxiety disorder: Secondary | ICD-10-CM

## 2013-07-06 LAB — RAPID URINE DRUG SCREEN, HOSP PERFORMED
Amphetamines: NOT DETECTED
BARBITURATES: NOT DETECTED
BENZODIAZEPINES: POSITIVE — AB
COCAINE: NOT DETECTED
OPIATES: NOT DETECTED
TETRAHYDROCANNABINOL: NOT DETECTED

## 2013-07-06 LAB — I-STAT CHEM 8, ED
BUN: 5 mg/dL — AB (ref 6–23)
CREATININE: 0.9 mg/dL (ref 0.50–1.35)
Calcium, Ion: 1.24 mmol/L — ABNORMAL HIGH (ref 1.12–1.23)
Chloride: 98 mEq/L (ref 96–112)
Glucose, Bld: 77 mg/dL (ref 70–99)
HCT: 46 % (ref 39.0–52.0)
Hemoglobin: 15.6 g/dL (ref 13.0–17.0)
POTASSIUM: 3.6 meq/L — AB (ref 3.7–5.3)
SODIUM: 142 meq/L (ref 137–147)
TCO2: 28 mmol/L (ref 0–100)

## 2013-07-06 LAB — CBC WITH DIFFERENTIAL/PLATELET
BASOS ABS: 0 10*3/uL (ref 0.0–0.1)
Basophils Relative: 0 % (ref 0–1)
Eosinophils Absolute: 0.1 10*3/uL (ref 0.0–0.7)
Eosinophils Relative: 1 % (ref 0–5)
HCT: 44.1 % (ref 39.0–52.0)
Hemoglobin: 14.9 g/dL (ref 13.0–17.0)
LYMPHS PCT: 39 % (ref 12–46)
Lymphs Abs: 2.6 10*3/uL (ref 0.7–4.0)
MCH: 31.2 pg (ref 26.0–34.0)
MCHC: 33.8 g/dL (ref 30.0–36.0)
MCV: 92.3 fL (ref 78.0–100.0)
Monocytes Absolute: 0.9 10*3/uL (ref 0.1–1.0)
Monocytes Relative: 13 % — ABNORMAL HIGH (ref 3–12)
Neutro Abs: 3.2 10*3/uL (ref 1.7–7.7)
Neutrophils Relative %: 48 % (ref 43–77)
PLATELETS: 233 10*3/uL (ref 150–400)
RBC: 4.78 MIL/uL (ref 4.22–5.81)
RDW: 13.5 % (ref 11.5–15.5)
WBC: 6.7 10*3/uL (ref 4.0–10.5)

## 2013-07-06 LAB — ETHANOL: Alcohol, Ethyl (B): <11 mg/dL (ref 0–11)

## 2013-07-06 NOTE — Consult Note (Signed)
  Review of Systems  Constitutional: Negative.   HENT: Negative.   Eyes: Negative.   Respiratory: Negative.   Cardiovascular: Negative.   Gastrointestinal: Negative.   Genitourinary: Negative.   Musculoskeletal: Negative.   Skin: Negative.   Neurological: Negative.   Endo/Heme/Allergies: Negative.   Psychiatric/Behavioral: Negative.    Denies any physical or medical issues

## 2013-07-06 NOTE — ED Notes (Signed)
Pt here for medical clearance for anxiety

## 2013-07-06 NOTE — ED Notes (Addendum)
Mother at bedside speaking with pt at present.  As per Dr Ladona Ridgelaylor & NP Denice BorsShuvon, pt cleared for discharge home.  Mother states pt not accepted back to home, that he is violent and suicidal.
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
## 2013-07-06 NOTE — Progress Notes (Signed)
Per, Dr. Ladona Ridgelaylor the patient is psychiatrically stable. Patient signed a no harm contract. Patient has been referred to [facility] that will begin on July 13, 2013 8:45am.
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
## 2013-07-06 NOTE — [facility] Suicide Risk Assessment (Signed)
Suicide Risk Assessment  Discharge Assessment     Demographic Factors:  Male and Adolescent or young adult  Total Time spent with patient: 1 hour  Psychiatric Specialty Exam:     Blood pressure 122/79, pulse 92, temperature 97.9 F (36.6 C), temperature source Oral, resp. rate 18, height 5\' 11"  (1.803 m), weight 70.308 kg (155 lb), SpO2 98.00%.Body mass index is 21.63 kg/(m^2).  General Appearance: Casual  Eye Contact::  Good  Speech:  Clear and Coherent  Volume:  Normal  Mood:  Anxious  Affect:  Appropriate  Thought Process:  Coherent  Orientation:  Full (Time, Place, and Person)  Thought Content:  Negative  Suicidal Thoughts:  No  Homicidal Thoughts:  No  Memory:  Immediate;   Good Recent;   Good Remote;   Good  Judgement:  Good  Insight:  Good  Psychomotor Activity:  Normal  Concentration:  Good  Recall:  Good  Fund of Knowledge:Good  Language: Good  Akathisia:  Negative  Handed:  Right  AIMS (if indicated):     Assets:  Communication Skills Desire for Improvement Financial Resources/Insurance Housing Intimacy Leisure Time Physical Health Social Support Transportation Vocational/Educational  Sleep:       Musculoskeletal: Strength & Muscle Tone: within normal limits Gait & Station: normal Patient leans: N/A   Mental Status Per Nursing Assessment::   On Admission:     Current Mental Status by Physician: NA  Loss Factors: NA  Historical Factors: NA  Risk Reduction Factors:   Sense of responsibility to family, Employed, Living with another person, especially a relative, Positive social support and Positive therapeutic relationship  Continued Clinical Symptoms:  Severe Anxiety and/or Agitation  Cognitive Features That Contribute To Risk:  none    Suicide Risk:  Minimal: No identifiable suicidal ideation.  Patients presenting with no risk factors but with morbid ruminations; may be classified as minimal risk based on the severity of the  depressive symptoms  Discharge Diagnoses:   AXIS I:  Anxiety Disorder NOS AXIS II:  Deferred AXIS III:   Past Medical History  Diagnosis Date  . Anxiety   . Insomnia    AXIS IV:  other psychosocial or environmental problems AXIS V:  51-60 moderate symptoms  Plan Of Care/Follow-up recommendations:  Activity:  no restrictions Diet:  no restrictions  Is patient on multiple antipsychotic therapies at discharge:  No   Has Patient had three or more failed trials of antipsychotic monotherapy by history:  No  Recommended Plan for Multiple Antipsychotic Therapies: NA    Larenzo Caples D 07/06/2013, 12:25 PM

## 2013-07-06 NOTE — ED Notes (Signed)
Dr Ladona Ridgelaylor & NP Denice BorsShuvon at bedside speaking with pt & mother at present.

## 2013-07-06 NOTE — ED Provider Notes (Signed)
Medical screening examination/treatment/procedure(s) were performed by non-physician practitioner and as supervising physician I was immediately available for consultation/collaboration.    Cayden Rautio, MD 07/06/13 2301 

## 2013-07-06 NOTE — ED Notes (Signed)
Pt requesting meds for anxiety.  Dr Ladona Ridgelaylor notified.  Pt reports he takes Neurontin and Valium at home.

## 2013-08-13 ENCOUNTER — Emergency Department (HOSPITAL_BASED_OUTPATIENT_CLINIC_OR_DEPARTMENT_OTHER)
Admission: EM | Admit: 2013-08-13 | Discharge: 2013-08-14 | Disposition: A | Discharge status: Home / Self Care | Payer: No Typology Code available for payment source | Attending: Emergency Medicine | Admitting: Emergency Medicine

## 2013-08-13 ENCOUNTER — Encounter (HOSPITAL_BASED_OUTPATIENT_CLINIC_OR_DEPARTMENT_OTHER): Payer: Self-pay | Admitting: Emergency Medicine

## 2013-08-13 DIAGNOSIS — T50902A Poisoning by unspecified drugs, medicaments and biological substances, intentional self-harm, initial encounter: Secondary | ICD-10-CM

## 2013-08-13 DIAGNOSIS — F1310 Sedative, hypnotic or anxiolytic abuse, uncomplicated: Secondary | ICD-10-CM | POA: Insufficient documentation

## 2013-08-13 DIAGNOSIS — T426X2A Poisoning by other antiepileptic and sedative-hypnotic drugs, intentional self-harm, initial encounter: Secondary | ICD-10-CM | POA: Diagnosis not present

## 2013-08-13 DIAGNOSIS — Z880 Allergy status to penicillin: Secondary | ICD-10-CM | POA: Insufficient documentation

## 2013-08-13 DIAGNOSIS — T4271XA Poisoning by unspecified antiepileptic and sedative-hypnotic drugs, accidental (unintentional), initial encounter: Secondary | ICD-10-CM | POA: Diagnosis present

## 2013-08-13 DIAGNOSIS — Z79899 Other long term (current) drug therapy: Secondary | ICD-10-CM | POA: Insufficient documentation

## 2013-08-13 DIAGNOSIS — G4700 Insomnia, unspecified: Secondary | ICD-10-CM | POA: Insufficient documentation

## 2013-08-13 DIAGNOSIS — F411 Generalized anxiety disorder: Secondary | ICD-10-CM | POA: Insufficient documentation

## 2013-08-13 DIAGNOSIS — T4272XA Poisoning by unspecified antiepileptic and sedative-hypnotic drugs, intentional self-harm, initial encounter: Secondary | ICD-10-CM | POA: Diagnosis not present

## 2013-08-13 DIAGNOSIS — G40909 Epilepsy, unspecified, not intractable, without status epilepticus: Secondary | ICD-10-CM | POA: Insufficient documentation

## 2013-08-13 HISTORY — DX: Opioid dependence, uncomplicated: F11.20

## 2013-08-13 LAB — COMPREHENSIVE METABOLIC PANEL
ALK PHOS: 64 U/L (ref 39–117)
ALT: 16 U/L (ref 0–53)
AST: 13 U/L (ref 0–37)
Albumin: 4.6 g/dL (ref 3.5–5.2)
BILIRUBIN TOTAL: 0.3 mg/dL (ref 0.3–1.2)
BUN: 7 mg/dL (ref 6–23)
CHLORIDE: 103 meq/L (ref 96–112)
CO2: 28 mEq/L (ref 19–32)
Calcium: 9.6 mg/dL (ref 8.4–10.5)
Creatinine, Ser: 0.9 mg/dL (ref 0.50–1.35)
GFR calc Af Amer: >90 mL/min (ref >90)
GFR calc non Af Amer: >90 mL/min (ref >90)
Glucose, Bld: 101 mg/dL — ABNORMAL HIGH (ref 70–99)
POTASSIUM: 3.8 meq/L (ref 3.7–5.3)
Sodium: 143 mEq/L (ref 137–147)
TOTAL PROTEIN: 7.2 g/dL (ref 6.0–8.3)

## 2013-08-13 LAB — RAPID URINE DRUG SCREEN, HOSP PERFORMED
Amphetamines: NOT DETECTED
BARBITURATES: NOT DETECTED
Benzodiazepines: POSITIVE — AB
Cocaine: NOT DETECTED
Opiates: NOT DETECTED
TETRAHYDROCANNABINOL: NOT DETECTED

## 2013-08-13 LAB — ETHANOL

## 2013-08-13 LAB — CBC
HEMATOCRIT: 42 % (ref 39.0–52.0)
HEMOGLOBIN: 14.7 g/dL (ref 13.0–17.0)
MCH: 32.4 pg (ref 26.0–34.0)
MCHC: 35 g/dL (ref 30.0–36.0)
MCV: 92.5 fL (ref 78.0–100.0)
Platelets: 204 10*3/uL (ref 150–400)
RBC: 4.54 MIL/uL (ref 4.22–5.81)
RDW: 12 % (ref 11.5–15.5)
WBC: 6.9 10*3/uL (ref 4.0–10.5)

## 2013-08-13 LAB — SALICYLATE LEVEL: Salicylate Lvl: <2 mg/dL — ABNORMAL LOW (ref 2.8–20.0)

## 2013-08-13 LAB — ACETAMINOPHEN LEVEL: Acetaminophen (Tylenol), Serum: <15 ug/mL (ref 10–30)

## 2013-08-13 MED ORDER — SODIUM CHLORIDE 0.9 % IV SOLN
Freq: Once | INTRAVENOUS | Status: AC
  Administered 2013-08-13: 1000 mL via INTRAVENOUS

## 2013-08-13 NOTE — ED Notes (Signed)
Pt has taken large amount of ambiem over past few days,  Denies si,  States felt anxious

## 2013-08-13 NOTE — ED Provider Notes (Signed)
CSN: [id number]     Arrival date & time 08/13/13  2218 History  This chart was scribed for Derwood KaplanAnkit Dejanique Ruehl, MD by Blanchard KelchNicole Curnes, ED Scribe. The patient was seen in room MH02/MH02. Patient's care was started at 11:38 PM.      Chief Complaint  Patient presents with  . Drug Overdose    Patient is a 23 y.o. male presenting with Overdose. The history is provided by the patient. No language interpreter was used.  Drug Overdose    HPI Comments: Germain Osgoodvan Arrona is a 23 y.o. male who presents to the Emergency Department due to a drug overdose. He states that he has taken ninety 10 mg tabs of Ambien over the course of four days. He has a prescription for the medication for insomnia but states that they were no longer working for him so that's why he took so many. He states that he volunteered the information about the overdose to his family. They called poison control who told the family to bring him to the ED. [place] going to [place] an opiate detox program in two days. He denies any symptoms, such as chest pain, shortness of breath or palpitations. He states that he is mildly sleepy. He denies SI currently. He reports a history of SI when he was younger. He reports a psychiatric history of anxiety and insomnia. He reports drug abuse of heroin two months ago but denies recent use. He also reports drug abuse currently with marijuana.  He reports seizure activity in the past when he hasn't taken his diazepam.    Past Medical History  Diagnosis Date  . Anxiety   . Insomnia   . Opiate addiction    History reviewed. No pertinent past surgical history. Family History  Problem Relation Age of Onset  . Cancer Other   . Diabetes Other    History  Substance Use Topics  . Smoking status: Never Smoker   . Smokeless tobacco: Never Used  . Alcohol Use: No    Review of Systems  All other systems reviewed and are negative.      Allergies  Penicillins and Serotonin reuptake inhibitors  (ssris)  Home Medications   Current Outpatient Rx  Name  Route  Sig  Dispense  Refill  . zolpidem (AMBIEN) 10 MG tablet   Oral   Take 10 mg by mouth at bedtime as needed for sleep.         . diazepam (VALIUM) 5 MG tablet   Oral   Take 10 mg by mouth 3 (three) times daily.         Marland Kitchen. gabapentin (NEURONTIN) 800 MG tablet   Oral   Take 1,600-2,400 mg by mouth 2 (two) times daily. 2 in am 3 at bedtime         . ibuprofen (ADVIL,MOTRIN) 200 MG tablet   Oral   Take 200 mg by mouth every 6 (six) hours as needed for mild pain.          Marland Kitchen. temazepam (RESTORIL) 15 MG capsule   Oral   Take 15 mg by mouth at bedtime as needed for sleep.          Triage Vitals: BP 123/83  Pulse 74  Temp(Src) 99.1 F (37.3 C) (Oral)  Resp 18  Ht 6' (1.829 m)  Wt 158 lb (71.668 kg)  BMI 21.42 kg/m2  SpO2 95%  Physical Exam  Nursing note and vitals reviewed. Constitutional: He is oriented to person, place, and time.  He appears well-developed and well-nourished. No distress.  HENT:  Head: Normocephalic and atraumatic.  Eyes: Right eye exhibits no nystagmus. Left eye exhibits no nystagmus.  Pupils are 6 mm, equal, reactive to light. No nystagmus seen.   Cardiovascular: Normal rate and regular rhythm.  Exam reveals no gallop and no friction rub.   No murmur heard. Pulmonary/Chest: Effort normal and breath sounds normal.  Lungs clear to ausculation and anterior ausculation.   Abdominal: Bowel sounds are normal.  Neurological: He is alert and oriented to person, place, and time.  Skin: Skin is warm and dry.    ED Course  Procedures (including critical care time)  DIAGNOSTIC STUDIES: Oxygen Saturation is 95% on room air, adequate by my interpretation.    COORDINATION OF CARE: 11:44 PM - Will call poison control. Patient verbalizes understanding and agrees with treatment plan.     Labs Review Labs Reviewed  COMPREHENSIVE METABOLIC PANEL - Abnormal; Notable for the following:     Glucose, Bld 101 (*)    All other components within normal limits  SALICYLATE LEVEL - Abnormal; Notable for the following:    Salicylate Lvl <2.0 (*)    All other components within normal limits  URINE RAPID DRUG SCREEN (HOSP PERFORMED) - Abnormal; Notable for the following:    Benzodiazepines POSITIVE (*)    All other components within normal limits  CBC  ETHANOL  ACETAMINOPHEN LEVEL   Imaging Review No results found.   EKG Interpretation   Date/Time:  Friday August 13 2013 23:05:25 EDT Ventricular Rate:  65 PR Interval:  122 QRS Duration: 86 QT Interval:  366 QTC Calculation: 380 R Axis:   70 Text Interpretation:  Normal sinus rhythm Early repolarization Normal ECG  Confirmed by Rhunette Croft, MD, Janey Genta [phone]) on 08/13/2013 11:55:37 PM      MDM   Final diagnoses:  None    I personally performed the services described in this documentation, which was scribed in my presence. The recorded information has been reviewed and is accurate.  Pt comes in with cc of ambien OD. No SI, HI, psychoses. He is aox3, with normal vitals at this time.  Poison control has already cleared him on the toxicity front. Pt is on his way to [place] for rehab on Sunday. Seizures are possible in withdrawals - and family informed. Pt on diazepam already, which will likely help him blunt the withdrawals.    Derwood Kaplan, MD 08/14/13 [phone]

## 2013-08-13 NOTE — ED Notes (Signed)
Pt states he has taken ambien 10mg  x 90 pills over the last 4 days-with 20 tabs over the last 24 hours-pt is A/O-NAD-pt brought in my aunt-pt denies as SI-states "just trying to get some sleep. they don't ever work for me"

## 2013-08-13 NOTE — ED Notes (Signed)
Call placed to poison control center: no need for activated charcoal at this time, routine labs and UDS

## 2013-08-14 MED ORDER — ONDANSETRON 4 MG PO TBDP: 4.0000 mg | ORAL_TABLET | Freq: Three times a day (TID) | ORAL | Status: DC | PRN

## 2013-08-14 MED ORDER — ACETAMINOPHEN ER 650 MG PO TBCR: 650.0000 mg | EXTENDED_RELEASE_TABLET | Freq: Three times a day (TID) | ORAL | Status: DC | PRN

## 2013-08-14 NOTE — ED Notes (Addendum)
posion controlled called to check on pt,, were given labs and vs,  They were going to close chart

## 2013-08-14 NOTE — Discharge Instructions (Signed)
We saw Gregory Freeman for [place].  During Ambien withdrawal physical withdrawal symptoms can accompany psychological anxiety symptoms. Less frequently, more severe symptoms, such as seizures, can appear. Other severe Ambien withdrawal symptoms include; memory loss, In these states it can be dangerous if the person is not being monitored.hallucinations, and confusion. Finally, recurrence of insomnia appears slowly and progressively within 2-3 weeks. Other symptoms of Ambien withdrawal include but are not limited to the following.  abdominal cramps anxiety apprehension dysphoria exacerbated insomnia fear mood changes nausea nightmares panic attacks shaking Vomiting  Treatment is diazepam - which Gregory Freeman is already taking. If there are seizures, come to the ER.  Withdrawals symptoms can last for upto a week.

## 2017-10-06 ENCOUNTER — Encounter (HOSPITAL_COMMUNITY): Payer: Self-pay

## 2017-10-06 ENCOUNTER — Other Ambulatory Visit: Payer: Self-pay

## 2017-10-06 ENCOUNTER — Inpatient Hospital Stay (HOSPITAL_COMMUNITY)
Admission: RE | Admit: 2017-10-06 | Discharge: 2017-10-13 | DRG: 885 | Disposition: A | Discharge status: Home / Self Care | Payer: BLUE CROSS/BLUE SHIELD | Attending: Psychiatry | Admitting: Psychiatry

## 2017-10-06 DIAGNOSIS — F429 Obsessive-compulsive disorder, unspecified: Secondary | ICD-10-CM | POA: Diagnosis present

## 2017-10-06 DIAGNOSIS — Z880 Allergy status to penicillin: Secondary | ICD-10-CM | POA: Diagnosis not present

## 2017-10-06 DIAGNOSIS — F200 Paranoid schizophrenia: Principal | ICD-10-CM | POA: Diagnosis present

## 2017-10-06 DIAGNOSIS — G4700 Insomnia, unspecified: Secondary | ICD-10-CM | POA: Diagnosis present

## 2017-10-06 DIAGNOSIS — F4481 Dissociative identity disorder: Secondary | ICD-10-CM | POA: Diagnosis present

## 2017-10-06 DIAGNOSIS — F1120 Opioid dependence, uncomplicated: Secondary | ICD-10-CM | POA: Diagnosis present

## 2017-10-06 DIAGNOSIS — F329 Major depressive disorder, single episode, unspecified: Secondary | ICD-10-CM | POA: Diagnosis present

## 2017-10-06 DIAGNOSIS — Z888 Allergy status to other drugs, medicaments and biological substances status: Secondary | ICD-10-CM

## 2017-10-06 DIAGNOSIS — Z915 Personal history of self-harm: Secondary | ICD-10-CM | POA: Diagnosis not present

## 2017-10-06 DIAGNOSIS — F17210 Nicotine dependence, cigarettes, uncomplicated: Secondary | ICD-10-CM | POA: Diagnosis present

## 2017-10-06 DIAGNOSIS — F419 Anxiety disorder, unspecified: Secondary | ICD-10-CM | POA: Diagnosis present

## 2017-10-06 DIAGNOSIS — F410 Panic disorder [episodic paroxysmal anxiety] without agoraphobia: Secondary | ICD-10-CM | POA: Diagnosis present

## 2017-10-06 DIAGNOSIS — F1290 Cannabis use, unspecified, uncomplicated: Secondary | ICD-10-CM | POA: Diagnosis present

## 2017-10-06 DIAGNOSIS — Z79899 Other long term (current) drug therapy: Secondary | ICD-10-CM

## 2017-10-06 MED ORDER — TRAZODONE HCL 50 MG PO TABS
50.0000 mg | ORAL_TABLET | Freq: Every evening | ORAL | Status: DC | PRN
  Administered 2017-10-07: 50 mg via ORAL
  Filled 2017-10-06: qty 1

## 2017-10-06 MED ORDER — ALUM & MAG HYDROXIDE-SIMETH 200-200-20 MG/5ML PO SUSP: 30.0000 mL | ORAL | Status: DC | PRN

## 2017-10-06 MED ORDER — ENSURE ENLIVE PO LIQD
237.0000 mL | Freq: Two times a day (BID) | ORAL | Status: DC
  Administered 2017-10-07 – 2017-10-13 (×9): 237 mL via ORAL

## 2017-10-06 MED ORDER — MAGNESIUM HYDROXIDE 400 MG/5ML PO SUSP: 30.0000 mL | Freq: Every day | ORAL | Status: DC | PRN

## 2017-10-06 MED ORDER — ARIPIPRAZOLE 5 MG PO TABS
5.0000 mg | ORAL_TABLET | Freq: Every day | ORAL | Status: DC
  Administered 2017-10-06: 5 mg via ORAL
  Filled 2017-10-06 (×3): qty 1

## 2017-10-06 MED ORDER — ACETAMINOPHEN 325 MG PO TABS
650.0000 mg | ORAL_TABLET | Freq: Four times a day (QID) | ORAL | Status: DC | PRN
  Administered 2017-10-07 – 2017-10-11 (×2): 650 mg via ORAL
  Filled 2017-10-06 (×2): qty 2

## 2017-10-06 MED ORDER — HYDROXYZINE HCL 25 MG PO TABS
25.0000 mg | ORAL_TABLET | Freq: Four times a day (QID) | ORAL | Status: DC | PRN
  Administered 2017-10-07 – 2017-10-12 (×4): 25 mg via ORAL
  Filled 2017-10-06 (×5): qty 1
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
## 2017-10-06 NOTE — Progress Notes (Signed)
Admission Note: Patient is an 27 year old male who presented as a walk-in with his mother for symptoms of anxiety, depression and difficulty sleeping.  Patient reports only sleeping 2-3 hours for the past three days.  Reports confusion, decreased in appetite and restlessness.  Presents with anxious affect and mood during admission process.  Patient appears paranoid and guarded with period of thought blocking.  Admission plan of care reviewed and consent signed.  Skin assessment completed.  Skin is dry and intact.  Patient oriented to the unit, staff and room.  Routine safety checks initiated.  Patient is safe on the unit.

## 2017-10-06 NOTE — Progress Notes (Signed)
Found patient standing in room. Slightly startled on this writer's approach. Anxious in affect and mood. Cautious during interaction, forwards minimal information. States his goal for today was "to feel better, I haven't been sleeping."   Medicated per orders and med education provided. Emotional support given along with reassurance. Advised patient to notify staff if he is unable to sleep tonight.  Patient verbalizes understanding of information provided. Denies SI/HI/AVH and remains safe on level III obs.

## 2017-10-06 NOTE — Tx Team (Signed)
Initial Treatment Plan 10/06/2017 5:43 PM Jsean Taussig WGN:[id number]    PATIENT STRESSORS: Financial difficulties Health problems   PATIENT STRENGTHS: Ability for insight Average or above average intelligence Communication skills Supportive family/friends   PATIENT IDENTIFIED PROBLEMS: "Anxiety"  "sleep"  Depression  Ineffective coping skills               DISCHARGE CRITERIA:  Ability to meet basic life and health needs Adequate post-discharge living arrangements Motivation to continue treatment in a less acute level of care  PRELIMINARY DISCHARGE PLAN: Attend aftercare/continuing care group Outpatient therapy Return to previous living arrangement  PATIENT/FAMILY INVOLVEMENT: This treatment plan has been presented to and reviewed with the patient, Gregory Freeman, and/or family member.  The patient and family have been given the opportunity to ask questions and make suggestions.  Clarene Critchley, RN 10/06/2017, 5:43 PM

## 2017-10-06 NOTE — H&P (Signed)
Behavioral Health Medical Screening Exam  Gregory Freeman is an 27 y.o. male.  Total Time spent with patient: 15 minutes  Psychiatric Specialty Exam:   Physical Exam  Constitutional: He appears well-developed and well-nourished.  Cardiovascular: Normal rate, regular rhythm and normal heart sounds.  GI: Soft. Bowel sounds are normal.  Musculoskeletal: Normal range of motion.    Review of Systems  Psychiatric/Behavioral: Positive for depression.       Severe paranoid ideation and persecutory delusions that others are secretly investigating him  All other systems reviewed and are negative.   There were no vitals taken for this visit.There is no height or weight on file to calculate BMI.  General Appearance: Disheveled  Eye Contact:  Absent  Speech:  Clear and Coherent and Normal Rate  Volume:  Decreased  Mood:  Anxious and Irritable  Affect:  Non-Congruent, Restricted and fearful  Thought Process:  Descriptions of Associations: Circumstantial  Orientation:  Full (Time, Place, and Person)  Thought Content:  Focused on fears of persecution and paranoid ideation  Suicidal Thoughts:  No  Homicidal Thoughts:  No  Memory:  Immediate;   Fair Recent;   Fair Remote;   Fair  Judgement:  Fair  Insight:  Fair  Psychomotor Activity:  Normal  Concentration: Concentration: Fair and Attention Span: Fair  Recall:  [employer] of Knowledge:Fair  Language: Fair  Akathisia:  No  Handed:    AIMS (if indicated):     Assets:  Communication Skills Desire for Improvement Resilience Social Support  Sleep:       Musculoskeletal: Strength & Muscle Tone: within normal limits Gait & Station: normal Patient leans: N/A  BP: 135/96 T 98.4 P 81 O2 Sat 98% RR 16  Recommendations:  Based on my evaluation the patient does not appear to have an emergency medical condition.   Inpatient psychiatric hospitalization.   Beau Fanny, FNP 10/06/2017, 3:41 PM

## 2017-10-07 DIAGNOSIS — F200 Paranoid schizophrenia: Principal | ICD-10-CM

## 2017-10-07 LAB — COMPREHENSIVE METABOLIC PANEL
ALBUMIN: 4.4 g/dL (ref 3.5–5.0)
ALT: 21 U/L (ref 17–63)
AST: 22 U/L (ref 15–41)
Alkaline Phosphatase: 57 U/L (ref 38–126)
Anion gap: 12 (ref 5–15)
BILIRUBIN TOTAL: 1.2 mg/dL (ref 0.3–1.2)
BUN: 6 mg/dL (ref 6–20)
CO2: 24 mmol/L (ref 22–32)
Calcium: 9.4 mg/dL (ref 8.9–10.3)
Chloride: 102 mmol/L (ref 101–111)
Creatinine, Ser: 0.77 mg/dL (ref 0.61–1.24)
GFR calc Af Amer: >60 mL/min (ref >60)
GFR calc non Af Amer: >60 mL/min (ref >60)
GLUCOSE: 100 mg/dL — AB (ref 65–99)
POTASSIUM: 3.7 mmol/L (ref 3.5–5.1)
SODIUM: 138 mmol/L (ref 135–145)
TOTAL PROTEIN: 7.1 g/dL (ref 6.5–8.1)

## 2017-10-07 LAB — LIPID PANEL
CHOL/HDL RATIO: 2.3 ratio
CHOLESTEROL: 102 mg/dL (ref 0–200)
HDL: 44 mg/dL (ref >40)
LDL CALC: 51 mg/dL (ref 0–99)
TRIGLYCERIDES: 35 mg/dL (ref <150)
VLDL: 7 mg/dL (ref 0–40)

## 2017-10-07 LAB — CBC
HCT: 43.1 % (ref 39.0–52.0)
HEMOGLOBIN: 14.9 g/dL (ref 13.0–17.0)
MCH: 31.3 pg (ref 26.0–34.0)
MCHC: 34.6 g/dL (ref 30.0–36.0)
MCV: 90.5 fL (ref 78.0–100.0)
Platelets: 232 10*3/uL (ref 150–400)
RBC: 4.76 MIL/uL (ref 4.22–5.81)
RDW: 12.4 % (ref 11.5–15.5)
WBC: 5.4 10*3/uL (ref 4.0–10.5)

## 2017-10-07 LAB — HEMOGLOBIN A1C
Hgb A1c MFr Bld: 5.2 % (ref 4.8–5.6)
Mean Plasma Glucose: 102.54 mg/dL

## 2017-10-07 LAB — TSH: TSH: 0.537 u[IU]/mL (ref 0.350–4.500)

## 2017-10-07 MED ORDER — BACLOFEN 10 MG PO TABS
10.0000 mg | ORAL_TABLET | Freq: Two times a day (BID) | ORAL | Status: DC
  Administered 2017-10-07 – 2017-10-13 (×13): 10 mg via ORAL
  Filled 2017-10-07 (×17): qty 1

## 2017-10-07 MED ORDER — GABAPENTIN 300 MG PO CAPS
600.0000 mg | ORAL_CAPSULE | Freq: Two times a day (BID) | ORAL | Status: DC
  Administered 2017-10-07 – 2017-10-13 (×13): 600 mg via ORAL
  Filled 2017-10-07 (×17): qty 2

## 2017-10-07 MED ORDER — OLANZAPINE 7.5 MG PO TABS
7.5000 mg | ORAL_TABLET | Freq: Every day | ORAL | Status: DC
  Administered 2017-10-07: 7.5 mg via ORAL
  Filled 2017-10-07: qty 3
  Filled 2017-10-07 (×2): qty 1

## 2017-10-07 NOTE — Progress Notes (Signed)
D: Pt was in the hallway upon initial approach.  He presents with anxious affect and depressed, anxious mood.  Describes his day as "pretty good" and reports goal is "to relax, get better, sleep better."  Pt reports he had good visits with his yoga teacher, mother, and father tonight.  Denies SI/HI and hallucinations.  Complains of back pain of 6/10.  Pt has been visible in milieu interacting with peers cautiously.  He did not attend evening group.  A: Introduced self to pt.  Actively listened to pt and provided support and encouragement.  Medication administered per order.  PRN medication administered for pain.  Heat packs provided for pain.  Q15 minute safety checks maintained.  R: During medication administration, pt was hesitant to take HS medication.  He was compliant with encouragement from staff.  He verbally contracts for safety and reports he will inform staff of needs and concerns.  Will continue to monitor and assess.

## 2017-10-07 NOTE — Progress Notes (Signed)
Did not attend group 

## 2017-10-07 NOTE — Plan of Care (Signed)
  Problem: Safety: Goal: Ability to remain free from injury will improve Outcome: Progressing Note: Pt has not harmed self or others tonight.  He denies SI/HI and verbally contracts for safety.    

## 2017-10-07 NOTE — Progress Notes (Signed)
Recreation Therapy Notes  Date: 5.28.19 Time: 1000 Location: 500 Hall Dayroom  Group Topic: Coping Skills  Goal Area(s) Addresses:  Patients will be able to identify positive coping skills. Patients will be able to identify benefits of coping skills. Patients will be able to identify benefits of using coping skills post d/c.  Intervention: Worksheet   Activity: Orthoptist.  Patients were to identify the things they feel have them stuck.  Patients were to write those things inside of the spider webb.  Patients would then identify at least 3 coping skills for each situation identified and write it on the outside of the webb.  Education: Pharmacologist, Building control surveyor.   Education Outcome: Acknowledges understanding/In group clarification offered/Needs additional education.   Clinical Observations/Feedback: Pt did not attend group.    Caroll Rancher, LRT/CTRS         Caroll Rancher A 10/07/2017 12:07 PM

## 2017-10-07 NOTE — Plan of Care (Signed)
Patient denies SI, HI and AVH.  Patient reported increased anxiety but decreased paranoia.  Patient is compliant with medications and treatment.  Patient has attended groups and unit activities.   Assess patient for safety, offer medications as prescribed, engage patient in 1:1 staff talks.   Patient able to contract for safety. Continue to monitor as planned.

## 2017-10-07 NOTE — H&P (Signed)
Psychiatric Admission Assessment Adult  Patient Identification: Gregory Freeman MRN:  [id number] Date of Evaluation:  10/07/2017 Chief Complaint:  schizophrenia Principal Diagnosis: Paranoid schizophrenia ([place]) Diagnosis:   Patient Active Problem List   Diagnosis Date Noted  . Paranoid schizophrenia ([place]) [F20.0] 10/06/2017   History of Present Illness:   Gregory Freeman is a 27 y/o M with history of schizophrenia who was admitted voluntarily from [place] where he presented with his mother due to worsening symptoms of depression, paranoia, anxiety, and delusions regarding conflict with co-worker. Pt was medically cleared and then transferred to [facility] for additional treatment and stabilization.  Upon initial presentation, pt shares, "I had a work situation. It was getting hostile with another coworker, basically was asking a lot of personal questions. I started having panic attacks and anxiety. I couldn't sleep well - for the past 4 nights I've only got 3 or 4 hours." Pt shares that a co-workers asked about his sexuality and also found out about his mental health diagnosis, and then began to bully the patient. Pt told a former HR representative, and now pt is fearful that the coworker has been attempting to retaliate against him. He feels that HR have been investigating him on social media. He feels fear that HR is planning to plan drugs in his possessions to sabotage the patient. Pt denies SI/HI/AH/VH. He reports poor sleep, anhedonia, guilty feelings, low energy, poor concentration, poor appetite, and psychomotor retardation. He denies symptoms of mania, OCD, and PTSD. He reports using 3-4 cigarettes daily and using about 0.5g of cannabis daily until about 5 days ago. He denies other illicit substance use.  Discussed with patient about treatment options. He reports good adherence with taking gabapentin 631m po BID and baclofen 113mpo BID prescribed by his PCP, but he denies other current medications.  Pt reports previous trials of diazepam, klonopin, lunesta, and seroquel ("made me feel out of it."). He has already been started on abilify at time of initial evaluation, but he would like to try something else to help with sleep and his appetite as well. Pt agrees to trial of zyprexa in addition to his current medications.   Associated Signs/Symptoms: Depression Symptoms:  depressed mood, anhedonia, insomnia, fatigue, feelings of worthlessness/guilt, difficulty concentrating, anxiety, decreased appetite, (Hypo) Manic Symptoms:  Delusions, Anxiety Symptoms:  Excessive Worry, Psychotic Symptoms:  Delusions, Ideas of Reference, Paranoia, PTSD Symptoms: NA Total Time spent with patient: 1 hour  Past Psychiatric History:   - pt reports previous diagnoses of "GAD, OCD, bipolar, multiple personalities," and schizophrenia. - about 3-4 inpt stays with last in 2015 - no current outpt provider - Denies history of suicide attempt, but as per chart review pt had overdose of opiates in 2010  Is the patient at risk to self? Yes.    Has the patient been a risk to self in the past 6 months? Yes.    Has the patient been a risk to self within the distant past? Yes.    Is the patient a risk to others? Yes.    Has the patient been a risk to others in the past 6 months? Yes.    Has the patient been a risk to others within the distant past? Yes.     Prior Inpatient Therapy: Prior Inpatient Therapy: Yes Prior Therapy Dates: 2010 and 2015 [facility]: SA Prior Outpatient Therapy: Prior Outpatient Therapy: Yes Prior Therapy Dates: Psych 2010 - 2015; Therapy 2013 Prior Therapy  Facilty/Provider(s): Psych: Dr. Jimmye Norman & Dr. Mamie Nick; Therapy: Dr. Josph Macho May Reason for Treatment: SA and anxiety Does patient have an ACCT team?: No Does patient have Intensive In-House Services?  : No Does patient have [facility] services? : No Does patient have  P4CC services?: No  Alcohol Screening: Patient refused Alcohol Screening Tool: Yes 1. How often do you have a drink containing alcohol?: Never 2. How many drinks containing alcohol do you have on a typical day when you are drinking?: 1 or 2 3. How often do you have six or more drinks on one occasion?: Never AUDIT-C Score: 0 4. How often during the last year have you found that you were not able to stop drinking once you had started?: Never 5. How often during the last year have you failed to do what was normally expected from you becasue of drinking?: Never 6. How often during the last year have you needed a first drink in the morning to get yourself going after a heavy drinking session?: Never 7. How often during the last year have you had a feeling of guilt of remorse after drinking?: Never 8. How often during the last year have you been unable to remember what happened the night before because you had been drinking?: Never 9. Have you or someone else been injured as a result of your drinking?: No 10. Has a relative or friend or a doctor or another health worker been concerned about your drinking or suggested you cut down?: No Alcohol Use Disorder Identification Test Final Score (AUDIT): 0 Intervention/Follow-up: Patient Refused Substance Abuse History in the last 12 months:  Yes.   Consequences of Substance Abuse: Medical Consequences:  worsened psychotic symptoms Previous Psychotropic Medications: Yes  Psychological Evaluations: Yes  Past Medical History:  Past Medical History:  Diagnosis Date  . Anxiety   . Insomnia   . Opiate addiction ([place])    History reviewed. No pertinent surgical history. Family History:  Family History  Problem Relation Age of Onset  . Cancer Other   . Diabetes Other    Family Psychiatric  History: pt reports maternal history of anxiety but he is unable to name individuals. Denies family history of suicide. Tobacco Screening: Have you used any form of  tobacco in the last 30 days? (Cigarettes, Smokeless Tobacco, Cigars, and/or Pipes): No Social History: Pt was born in [place]. He has been living in [place] since 1997. He lives with his parents. 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 He has never been married and has no children. He denies trauma history. He has no legal history. Social History   Substance and Sexual Activity  Alcohol Use No     Social History   Substance and Sexual Activity  Drug Use Yes  . Types: Marijuana   Comment: heroin    Additional Social History: Marital status: Single Are you sexually active?: No What is your sexual orientation?: gay Does patient have children?: No    Pain Medications: Please see MAR Prescriptions: Please see MAR Over the Counter: Please see MAR History of alcohol / drug use?: Yes Longest period of sobriety (when/how long): Unknown Name of Substance 1: Marijuana 1 - Age of First Use: 15 1 - Amount (size/oz): 2-4 grams 1 - Frequency: Daily 1 - Duration: 2 years  1 - Last Use / Amount: 1 month ago Name of Substance 2: Opoiods 2 - Age of First Use: Unknown 2 - Amount (size/oz): Unknown 2 -  Frequency: Daily 2 - Duration: Unknown 2 - Last Use / Amount: 2015 [place]                Allergies:   Allergies  Allergen Reactions  . Serotonin Reuptake Inhibitors (Ssris) Other (See Comments)    Suicidal behavior  . Penicillins Rash and Other (See Comments)    Has patient had a PCN reaction causing immediate rash, facial/tongue/throat swelling, SOB or lightheadedness with hypotension: yes Has patient had a PCN reaction causing severe rash involving mucus membranes or skin necrosis: no Has patient had a PCN reaction that required hospitalization: no Has patient had a PCN reaction occurring within the last 10 years: yes - 2 yrs ago If all of the above answers are "NO", then may proceed with Cephalosporin  use.    Lab Results:  Results for orders placed or performed during the hospital encounter of 10/06/17 (from the past 48 hour(s))  CBC     Status: None   Collection Time: 10/07/17  6:25 AM  Result Value Ref Range   WBC 5.4 4.0 - 10.5 K/uL   RBC 4.76 4.22 - 5.81 MIL/uL   Hemoglobin 14.9 13.0 - 17.0 g/dL   HCT 43.1 39.0 - 52.0 %   MCV 90.5 78.0 - 100.0 fL   MCH 31.3 26.0 - 34.0 pg   MCHC 34.6 30.0 - 36.0 g/dL   RDW 12.4 11.5 - 15.5 %   Platelets 232 150 - 400 K/uL    Comment: Performed at [facility], [place] [address], [place], [place] [address]  Comprehensive metabolic panel     Status: Abnormal   Collection Time: 10/07/17  6:25 AM  Result Value Ref Range   Sodium 138 135 - 145 mmol/L   Potassium 3.7 3.5 - 5.1 mmol/L   Chloride 102 101 - 111 mmol/L   CO2 24 22 - 32 mmol/L   Glucose, Bld 100 (H) 65 - 99 mg/dL   BUN 6 6 - 20 mg/dL   Creatinine, Ser 0.77 0.61 - 1.24 mg/dL   Calcium 9.4 8.9 - 10.3 mg/dL   Total Protein 7.1 6.5 - 8.1 g/dL   Albumin 4.4 3.5 - 5.0 g/dL   AST 22 15 - 41 U/L   ALT 21 17 - 63 U/L   Alkaline Phosphatase 57 38 - 126 U/L   Total Bilirubin 1.2 0.3 - 1.2 mg/dL   GFR calc non Af Amer >60 >60 mL/min   GFR calc Af Amer >60 >60 mL/min    Comment: (NOTE) The eGFR has been calculated using the CKD EPI equation. This calculation has not been validated in all clinical situations. eGFR's persistently <60 mL/min signify possible Chronic Kidney Disease.    Anion gap 12 5 - 15    Comment: Performed at [facility], [place] [address], [place], [place] [address]  Hemoglobin A1c     Status: None   Collection Time: 10/07/17  6:25 AM  Result Value Ref Range   Hgb A1c MFr Bld 5.2 4.8 - 5.6 %    Comment: (NOTE) Pre diabetes:          5.7%-6.4% Diabetes:              >6.4% Glycemic control for   <7.0% adults with diabetes    Mean Plasma Glucose 102.54 mg/dL    Comment: Performed at [facility] [address]  [place], [place] [address]  Lipid panel  Status: None   Collection Time: 10/07/17  6:25 AM  Result Value Ref Range   Cholesterol 102 0 - 200 mg/dL   Triglycerides 35 <150 mg/dL   HDL 44 >40 mg/dL   Total CHOL/HDL Ratio 2.3 RATIO   VLDL 7 0 - 40 mg/dL   LDL Cholesterol 51 0 - 99 mg/dL    Comment:        Total Cholesterol/HDL:CHD Risk Coronary Heart Disease Risk Table                     Men   Women  1/2 Average Risk   3.4   3.3  Average Risk       5.0   4.4  2 X Average Risk   9.6   7.1  3 X Average Risk  23.4   11.0        Use the calculated Patient Ratio above and the CHD Risk Table to determine the patient's CHD Risk.        ATP III CLASSIFICATION (LDL):  <100     mg/dL   Optimal  100-129  mg/dL   Near or Above                    Optimal  130-159  mg/dL   Borderline  160-189  mg/dL   High  >190     mg/dL   Very High Performed at [facility] [address] [place], [place] [address]   TSH     Status: None   Collection Time: 10/07/17  6:25 AM  Result Value Ref Range   TSH 0.537 0.350 - 4.500 uIU/mL    Comment: Performed by a 3rd Generation assay with a functional sensitivity of <=0.01 uIU/mL. Performed at [facility], [place] [address], [place], [place] [address]     Blood Alcohol level:  Lab Results  Component Value Date   [facility] <11 08/13/2013   ETH <11 78/67/6720    Metabolic Disorder Labs:  Lab Results  Component Value Date   HGBA1C 5.2 10/07/2017   MPG 102.54 10/07/2017   No results found for: PROLACTIN Lab Results  Component Value Date   CHOL 102 10/07/2017   TRIG 35 10/07/2017   HDL 44 10/07/2017   CHOLHDL 2.3 10/07/2017   VLDL 7 10/07/2017   LDLCALC 51 10/07/2017    Current Medications: Current Facility-Administered Medications  Medication Dose Route Frequency Provider Last Rate Last Dose  . acetaminophen (TYLENOL) tablet 650 mg  650 mg Oral Q6H PRN Withrow, Elyse Jarvis, FNP      . alum & mag hydroxide-simeth  (MAALOX/MYLANTA) 200-200-20 MG/5ML suspension 30 mL  30 mL Oral Q4H PRN Withrow, John C, FNP      . baclofen (LIORESAL) tablet 10 mg  10 mg Oral BID Pennelope Bracken, MD   10 mg at 10/07/17 1203  . feeding supplement (ENSURE ENLIVE) (ENSURE ENLIVE) liquid 237 mL  237 mL Oral BID BM Pennelope Bracken, MD   237 mL at 10/07/17 1203  . gabapentin (NEURONTIN) capsule 600 mg  600 mg Oral BID Pennelope Bracken, MD   600 mg at 10/07/17 1203  . hydrOXYzine (ATARAX/VISTARIL) tablet 25 mg  25 mg Oral Q6H PRN Benjamine Mola, FNP   25 mg at 10/07/17 0917  . magnesium hydroxide (MILK OF MAGNESIA) suspension 30 mL  30 mL Oral Daily PRN Withrow, Elyse Jarvis, FNP      . OLANZapine (ZYPREXA)  tablet 7.5 mg  7.5 mg Oral QHS Pennelope Bracken, MD      . traZODone (DESYREL) tablet 50 mg  50 mg Oral QHS PRN Benjamine Mola, FNP   50 mg at 10/07/17 0133   PTA Medications: Medications Prior to Admission  Medication Sig Dispense Refill Last Dose  . baclofen (LIORESAL) 10 MG tablet Take 10 mg by mouth 2 (two) times daily.  1 10/06/2017  . diazepam (VALIUM) 10 MG tablet Take 10 mg by mouth 3 (three) times daily as needed for anxiety.    last year  . gabapentin (NEURONTIN) 600 MG tablet Take 600 mg by mouth 2 (two) times daily.   10/06/2017  . hydrOXYzine (VISTARIL) 25 MG capsule Take 25 mg by mouth 3 (three) times daily as needed for anxiety.    10/05/2017  . methocarbamol (ROBAXIN) 500 MG tablet Take 500 mg by mouth every 8 (eight) hours as needed for muscle spasms.   0 Past Month  . Omega-3 Fatty Acids (FISH OIL PO) Take 1 capsule by mouth daily.   10/06/2017  . tiZANidine (ZANAFLEX) 4 MG tablet Take 4 mg by mouth at bedtime as needed for muscle spasms.    unknown    Musculoskeletal: Strength & Muscle Tone: within normal limits Gait & Station: normal Patient leans: N/A  Psychiatric Specialty Exam: Physical Exam  Nursing note and vitals reviewed.   Review of Systems  Constitutional: Negative  for chills and fever.  Respiratory: Negative for cough and shortness of breath.   Cardiovascular: Negative for chest pain.  Gastrointestinal: Negative for abdominal pain, heartburn, nausea and vomiting.  Psychiatric/Behavioral: Positive for depression and substance abuse. Negative for suicidal ideas. The patient is nervous/anxious and has insomnia.     Blood pressure 111/83, pulse 96, temperature 98.7 F (37.1 C), temperature source Oral, resp. rate 16, height 6' (1.829 m), weight 63.5 kg (140 lb), SpO2 100 %.Body mass index is 18.99 kg/m.  General Appearance: Casual and Fairly Groomed  Eye Contact:  Good  Speech:  Clear and Coherent and Normal Rate  Volume:  Normal  Mood:  Anxious and Depressed  Affect:  Flat  Thought Process:  Coherent and Goal Directed  Orientation:  Full (Time, Place, and Person)  Thought Content:  Delusions, Ideas of Reference:   Paranoia Delusions and Paranoid Ideation  Suicidal Thoughts:  No  Homicidal Thoughts:  No  Memory:  Immediate;   Fair Recent;   Fair Remote;   Fair  Judgement:  Poor  Insight:  Lacking  Psychomotor Activity:  Normal  Concentration:  Concentration: Fair  Recall:  [employer] of Knowledge:  Fair  Language:  Fair  Akathisia:  No  Handed:    AIMS (if indicated):     Assets:  Armed forces logistics/support/administrative officer Physical Health Resilience Social Support  ADL's:  Intact  Cognition:  WNL  Sleep:  Number of Hours: 2.5    Treatment Plan Summary: Daily contact with patient to assess and evaluate symptoms and progress in treatment and Medication management  Observation Level/Precautions:  15 minute checks  Laboratory:  CBC Chemistry Profile HbAIC UDS UA  Psychotherapy:  Encourage participation in groups and therapeutic milieu   Medications:  Start zyprexa 7.6m po qhs. Restart gabapentin 6047mpo BID. Restart baclofen 103mo BID. Continue vistaril 32m14m q6h prn anxiety. Continue trazodone 50mg38mqhs prn insomnia.  Consultations:     Discharge Concerns:    Estimated LOS: 5-7 days  Other:     Physician Treatment  Plan for Primary Diagnosis: Paranoid schizophrenia ([place]) Long Term Goal(s): Improvement in symptoms so as ready for discharge  Short Term Goals: Ability to identify and develop effective coping behaviors will improve  Physician Treatment Plan for Secondary Diagnosis: Principal Problem:   Paranoid schizophrenia ([place])  Long Term Goal(s): Improvement in symptoms so as ready for discharge  Short Term Goals: Ability to identify triggers associated with substance abuse/mental health issues will improve  I certify that inpatient services furnished can reasonably be expected to improve the patient's condition.    Pennelope Bracken, MD 5/28/20194:54 PM

## 2017-10-07 NOTE — BHH Group Notes (Signed)
LCSW Group Therapy Note   10/07/2017 1:15pm   Type of Therapy and Topic:  Group Therapy:  Overcoming Obstacles   Participation Level:  Active   Description of Group:    In this group patients will be encouraged to explore what they see as obstacles to their own wellness and recovery. They will be guided to discuss their thoughts, feelings, and behaviors related to these obstacles. The group will process together ways to cope with barriers, with attention given to specific choices patients can make. Each patient will be challenged to identify changes they are motivated to make in order to overcome their obstacles. This group will be process-oriented, with patients participating in exploration of their own experiences as well as giving and receiving support and challenge from other group members.   Therapeutic Goals: 1. Patient will identify personal and current obstacles as they relate to admission. 2. Patient will identify barriers that currently interfere with their wellness or overcoming obstacles.  3. Patient will identify feelings, thought process and behaviors related to these barriers. 4. Patient will identify two changes they are willing to make to overcome these obstacles:      Summary of Patient Progress   Stayed the entire time, engaged throughout.  "When my anxiety takes over, I make impulsive decisions, usually that are not in my best interest. I know when I am getting anxious, my body gives me cues, and so I need to do a better job paying attention, and then focusing on breathing, which helps me bring my anxiety down."   Therapeutic Modalities:   Cognitive Behavioral Therapy Solution Focused Therapy Motivational Interviewing Relapse Prevention Therapy  [name], LCSW 10/07/2017 1:53 PM

## 2017-10-07 NOTE — Progress Notes (Signed)
NUTRITION ASSESSMENT  Pt identified as at risk on the Malnutrition Screen Tool  INTERVENTION: 1. Educated patient on the importance of nutrition and encouraged intake of food and beverages. 2. Discussed weight goals. 3. Supplements: continue Ensure Enlive BID, each supplement provides 350 kcal and 20 grams of protein.  NUTRITION DIAGNOSIS: Unintentional weight loss related to sub-optimal intake as evidenced by pt report.   Goal: Pt to meet >/= 90% of their estimated nutrition needs.  Monitor:  PO intake  Assessment:  Pt admitted for overwhelming anxiety and mom's report of manic behavior x2 days PTA with only 2-3 hours of sleep per day and pt not eating. Notes also indicate some possible paranoia. Per chart review, pt weighed 145 lbs at [facility] on 09/21/17. Based on current weight, this indicates 5 lb weight loss (3.6% body weight) in the past 2 weeks; this is significant for time frame.  Ensure Enlive ordered BID at the time of admission per [facility] protocol. Continue to encourage PO intakes of meals, supplements, and snacks.      27 y.o. male  Height: Ht Readings from Last 1 Encounters:  10/06/17 6' (1.829 m)    Weight: Wt Readings from Last 1 Encounters:  10/06/17 140 lb (63.5 kg)    Weight Hx: Wt Readings from Last 10 Encounters:  10/06/17 140 lb (63.5 kg)  08/13/13 158 lb (71.7 kg)  07/05/13 155 lb (70.3 kg)  06/18/13 150 lb (68 kg)  12/20/12 150 lb 8 oz (68.3 kg)    BMI:  Body mass index is 18.99 kg/m. Pt meets criteria for normal weight based on current BMI.  Estimated Nutritional Needs: Kcal: 25-30 kcal/kg Protein: > 1 gram protein/kg Fluid: 1 ml/kcal  Diet Order:  Diet Order           Diet regular Room service appropriate? Yes; Fluid consistency: Thin  Diet effective now         Pt is also offered choice of unit snacks mid-morning and mid-afternoon.  Pt is eating as desired.   Lab results and medications reviewed.      Trenton Gammon,  MS, RD, LDN, [facility] Inpatient Clinical Dietitian Pager # [phone] After hours/weekend pager # [phone]

## 2017-10-07 NOTE — BHH Suicide Risk Assessment (Signed)
[facility] Admission Suicide Risk Assessment   Nursing information obtained from:  Patient Demographic factors:  Male Current Mental Status:  NA Loss Factors:  NA Historical Factors:  NA Risk Reduction Factors:  Living with another person, especially a relative, Employed  Total Time spent with patient: 1 hour Principal Problem: Paranoid schizophrenia ([facility]) Diagnosis:   Patient Active Problem List   Diagnosis Date Noted  . Paranoid schizophrenia ([facility]) [F20.0] 10/06/2017   Subjective Data: See H&P for details  Continued Clinical Symptoms:  Alcohol Use Disorder Identification Test Final Score (AUDIT): 0 The "Alcohol Use Disorders Identification Test", Guidelines for Use in Primary Care, Second Edition.  World Science writer [facility]). Score between 0-7:  no or low risk or alcohol related problems. Score between 8-15:  moderate risk of alcohol related problems. Score between 16-19:  high risk of alcohol related problems. Score 20 or above:  warrants further diagnostic evaluation for alcohol dependence and treatment.   CLINICAL FACTORS:   Severe Anxiety and/or Agitation Depression:   Insomnia Severe Schizophrenia:   Paranoid or undifferentiated type Chronic Pain More than one psychiatric diagnosis Currently Psychotic Unstable or Poor Therapeutic Relationship Previous Psychiatric Diagnoses and Treatments Medical Diagnoses and Treatments/Surgeries   Psychiatric Specialty Exam: Physical Exam  Nursing note and vitals reviewed.   ROS- See H&P for details  Blood pressure 111/83, pulse 96, temperature 98.7 F (37.1 C), temperature source Oral, resp. rate 16, height 6' (1.829 m), weight 63.5 kg (140 lb), SpO2 100 %.Body mass index is 18.99 kg/m.   COGNITIVE FEATURES THAT CONTRIBUTE TO RISK:  None    SUICIDE RISK:   Minimal: No identifiable suicidal ideation.  Patients presenting with no risk factors but with morbid ruminations; may be classified as minimal risk based on the  severity of the depressive symptoms  PLAN OF CARE: See H&P for details  I certify that inpatient services furnished can reasonably be expected to improve the patient's condition.   Micheal Likens, MD 10/07/2017, 5:09 PM

## 2017-10-08 LAB — PROLACTIN: PROLACTIN: 6.3 ng/mL (ref 4.0–15.2)

## 2017-10-08 MED ORDER — DULOXETINE HCL 30 MG PO CPEP
30.0000 mg | ORAL_CAPSULE | Freq: Every day | ORAL | Status: DC
  Administered 2017-10-08 – 2017-10-13 (×5): 30 mg via ORAL
  Filled 2017-10-08 (×8): qty 1

## 2017-10-08 MED ORDER — TRAZODONE HCL 50 MG PO TABS
50.0000 mg | ORAL_TABLET | Freq: Every evening | ORAL | Status: DC | PRN
  Administered 2017-10-09: 50 mg via ORAL
  Filled 2017-10-08 (×2): qty 1

## 2017-10-08 MED ORDER — RISPERIDONE 0.5 MG PO TABS
0.5000 mg | ORAL_TABLET | Freq: Two times a day (BID) | ORAL | Status: DC
  Administered 2017-10-08 – 2017-10-09 (×3): 0.5 mg via ORAL
  Filled 2017-10-08 (×8): qty 1

## 2017-10-08 NOTE — BHH Counselor (Signed)
Adult Comprehensive Assessment  Patient ID: Gregory Freeman, male   DOB: [birth year], 27 y.o.   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 Physical health (include injuries & life threatening diseases): Scoliosis Substance abuse: Admits to cannabis use daily, admits to history of opiate dependence  Living/Environment/Situation:  Living Arrangements: Parent Who else lives in the home?: parents How long has patient lived in current situation?: always What is atmosphere in current home: Comfortable  Family History:  Marital status: Single Are you sexually active?: No What is your sexual orientation?: gay Does patient have children?: No  Childhood History:  By whom was/is the patient raised?: Both parents Description of patient's relationship with caregiver when they were a child: verbally abusive by both-but more monm Patient's description of current relationship with people who raised him/her: bettere Does patient have siblings?: Yes Number of Siblings: 1 Description of patient's current relationship with siblings: sister lives at home Did patient suffer any verbal/emotional/physical/sexual abuse as a child?: No Did patient suffer from severe childhood neglect?: No Has patient ever been sexually abused/assaulted/raped as an adolescent or adult?: No Was the patient ever a victim of a crime or a disaster?: No Witnessed domestic violence?: No Has patient been effected by domestic violence as an adult?: No  Education:  Highest grade of school patient has completed: 16 Currently a Consulting civil engineer?: No Learning disability?: No  Employment/Work Situation:   Employment  situation: Employed 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 Unable to concentrate on work What is the longest time patient has a held a job?: same Are There Guns or Education officer, community in Your Home?: No  Financial Resources:   Financial resources: Income from employment Does patient have a representative payee or guardian?: No  Alcohol/Substance Abuse:   What has been your use of drugs/alcohol within the last 12 months?: Denies alcohol, stopped smoking connabis about 5 days ago-was smoking every day Alcohol/Substance Abuse Treatment Hx: Denies past history Has alcohol/substance abuse ever caused legal problems?: No  Social Support System:   Forensic psychologist System: Poor Describe Community Support System: parents, sister, and some other people Type of faith/religion: N/A How does patient's faith help to cope with current illness?: Spirituality-through yoga, "but I don't practice it enough"  Leisure/Recreation:   Leisure and Hobbies: Yoga, I work Theme park manager, I got a Photographer recently  Strengths/Needs:   What is the patient's perception of their strengths?: writing, analyzing information Patient states they can use these personal strengths during their treatment to contribute to their recovery: in general, it can help with other things Patient states these barriers may affect/interfere with their treatment: "Stay here has not been helpful." Patient states these barriers may affect their return to the community: None identified  Discharge Plan:   Currently receiving community mental health services: No Patient states they will know when they are safe and ready for discharge when: "I don't think this place is helping-but I do think I could be  helped." Does patient have access to  transportation?: Yes Does patient have financial barriers related to discharge medications?: No Will patient be returning to same living situation after discharge?: Yes  Summary/Recommendations:   Summary and Recommendations (to be completed by the evaluator): Gregory Freeman is a 27 YO Caucasian male diagnosed with Schizophrenia.  He presents voluntarily with increased anxiety, panic attacks and interrupted sleep.  Gregory Freeman cites stressors at work as the catalyst for these symptoms.  At d/c, he will return home and follow up at [facility]  While here, Gregory Freeman can benefit from crises stabilization, medication management, therapeutic milieu and coordination with outpatient provider.  [name]. 10/08/2017

## 2017-10-08 NOTE — Tx Team (Signed)
Interdisciplinary Treatment and Diagnostic Plan Update  10/08/2017 Time of Session: 9:40 AM  Keshon Markovitz MRN: [id number]  Principal Diagnosis: Paranoid schizophrenia [facility])  Secondary Diagnoses: Principal Problem:   Paranoid schizophrenia ([place])   Current Medications:  Current Facility-Administered Medications  Medication Dose Route Frequency Provider Last Rate Last Dose  . acetaminophen (TYLENOL) tablet 650 mg  650 mg Oral Q6H PRN Benjamine Mola, FNP   650 mg at 10/07/17 2013  . alum & mag hydroxide-simeth (MAALOX/MYLANTA) 200-200-20 MG/5ML suspension 30 mL  30 mL Oral Q4H PRN Withrow, John C, FNP      . baclofen (LIORESAL) tablet 10 mg  10 mg Oral BID Pennelope Bracken, MD   10 mg at 10/08/17 0818  . feeding supplement (ENSURE ENLIVE) (ENSURE ENLIVE) liquid 237 mL  237 mL Oral BID BM Pennelope Bracken, MD   237 mL at 10/08/17 0935  . gabapentin (NEURONTIN) capsule 600 mg  600 mg Oral BID Maris Berger T, MD   600 mg at 10/08/17 0818  . hydrOXYzine (ATARAX/VISTARIL) tablet 25 mg  25 mg Oral Q6H PRN Benjamine Mola, FNP   25 mg at 10/07/17 0917  . magnesium hydroxide (MILK OF MAGNESIA) suspension 30 mL  30 mL Oral Daily PRN Withrow, Elyse Jarvis, FNP      . OLANZapine (ZYPREXA) tablet 7.5 mg  7.5 mg Oral QHS Pennelope Bracken, MD   7.5 mg at 10/07/17 2107  . traZODone (DESYREL) tablet 50 mg  50 mg Oral QHS PRN Benjamine Mola, FNP   50 mg at 10/07/17 0133    PTA Medications: Medications Prior to Admission  Medication Sig Dispense Refill Last Dose  . baclofen (LIORESAL) 10 MG tablet Take 10 mg by mouth 2 (two) times daily.  1 10/06/2017  . diazepam (VALIUM) 10 MG tablet Take 10 mg by mouth 3 (three) times daily as needed for anxiety.    last year  . gabapentin (NEURONTIN) 600 MG tablet Take 600 mg by mouth 2 (two) times daily.   10/06/2017  . hydrOXYzine (VISTARIL) 25 MG capsule Take 25 mg by mouth 3 (three) times daily as needed for anxiety.    10/05/2017  .  methocarbamol (ROBAXIN) 500 MG tablet Take 500 mg by mouth every 8 (eight) hours as needed for muscle spasms.   0 Past Month  . Omega-3 Fatty Acids (FISH OIL PO) Take 1 capsule by mouth daily.   10/06/2017  . tiZANidine (ZANAFLEX) 4 MG tablet Take 4 mg by mouth at bedtime as needed for muscle spasms.    unknown    Patient Stressors: Financial difficulties Health problems  Patient Strengths: Ability for insight Average or above average intelligence Communication skills Supportive family/friends  Treatment Modalities: Medication Management, Group therapy, Case management,  1 to 1 session with clinician, Psychoeducation, Recreational therapy.   Physician Treatment Plan for Primary Diagnosis: Paranoid schizophrenia ([place]) Long Term Goal(s): Improvement in symptoms so as ready for discharge  Short Term Goals: Ability to identify and develop effective coping behaviors will improve Ability to identify triggers associated with substance abuse/mental health issues will improve  Medication Management: Evaluate patient's response, side effects, and tolerance of medication regimen.  Therapeutic Interventions: 1 to 1 sessions, Unit Group sessions and Medication administration.  Evaluation of Outcomes: Progressing  Physician Treatment Plan for Secondary Diagnosis: Principal Problem:   Paranoid schizophrenia ([place])   Long Term Goal(s): Improvement in symptoms so as ready for discharge  Short Term Goals: Ability to identify and develop effective  coping behaviors will improve Ability to identify triggers associated with substance abuse/mental health issues will improve  Medication Management: Evaluate patient's response, side effects, and tolerance of medication regimen.  Therapeutic Interventions: 1 to 1 sessions, Unit Group sessions and Medication administration.  Evaluation of Outcomes: Progressing   RN Treatment Plan for Primary Diagnosis: Paranoid schizophrenia ([place]) Long Term Goal(s):  Knowledge of disease and therapeutic regimen to maintain health will improve  Short Term Goals: Ability to identify and develop effective coping behaviors will improve and Compliance with prescribed medications will improve  Medication Management: RN will administer medications as ordered by provider, will assess and evaluate patient's response and provide education to patient for prescribed medication. RN will report any adverse and/or side effects to prescribing provider.  Therapeutic Interventions: 1 on 1 counseling sessions, Psychoeducation, Medication administration, Evaluate responses to treatment, Monitor vital signs and CBGs as ordered, Perform/monitor CIWA, COWS, AIMS and Fall Risk screenings as ordered, Perform wound care treatments as ordered.  Evaluation of Outcomes: Progressing   LCSW Treatment Plan for Primary Diagnosis: Paranoid schizophrenia ([place]) Long Term Goal(s): Safe transition to appropriate next level of care at discharge, Engage patient in therapeutic group addressing interpersonal concerns.  Short Term Goals: Engage patient in aftercare planning with referrals and resources  Therapeutic Interventions: Assess for all discharge needs, 1 to 1 time with Social worker, Explore available resources and support systems, Assess for adequacy in community support network, Educate family and significant other(s) on suicide prevention, Complete Psychosocial Assessment, Interpersonal group therapy.  Evaluation of Outcomes: Met  Return home, follow up current provider   Progress in Treatment: Attending groups: Yes Participating in groups: Yes Taking medication as prescribed: Yes Toleration medication: Yes, no side effects reported at this time Family/Significant other contact made: Yes Patient understands diagnosis: Yes AEB asking for help with anxiety Discussing patient identified problems/goals with staff: Yes Medical problems stabilized or resolved: Yes Denies  suicidal/homicidal ideation: Yes Issues/concerns per patient self-inventory: None Other: N/A  New problem(s) identified: None identified at this time.   New Short Term/Long Term Goal(s): "I want to learn to calm myself down internally."   Discharge Plan or Barriers:   Reason for Continuation of Hospitalization: Anxiety Paranoia Medication stabilization   Estimated Length of Stay: 6/3  Attendees: Patient: Purnell Daigle 10/08/2017  9:40 AM  Physician: Maris Berger, MD 10/08/2017  9:40 AM  Nursing: Phillis Haggis, RN 10/08/2017  9:40 AM  RN Care Manager: Lars Pinks, RN 10/08/2017  9:40 AM  Social Worker: Ripley Fraise 10/08/2017  9:40 AM  Recreational Therapist: Winfield Cunas 10/08/2017  9:40 AM  Other: Norberto Sorenson 10/08/2017  9:40 AM  Other:  10/08/2017  9:40 AM    Scribe for Treatment Team:  Roque Lias LCSW 10/08/2017 9:40 AM

## 2017-10-08 NOTE — Progress Notes (Signed)
Pt was observed in room, seen resting in bed with eyes open. Pt denies SI/HI/AVH/Pain at this time. Pt attended wrap-up group this evening. Pt states he feels ready to go home. Pt states "I just needed to get some sleep". Pt continue to state that he feels drowsy with meds. Risperdal was started at bedtime. Pt was educated to report abnormal s/s. Pt appears isolative in milieu. Will continue with POC.

## 2017-10-08 NOTE — Progress Notes (Signed)
Recreation Therapy Notes  INPATIENT RECREATION THERAPY ASSESSMENT  Patient Details Name: Keyvon Herter MRN: [id number] DOB: 11/17/1990 Today's Date: 10/08/2017       Information Obtained From: Patient  Able to Participate in Assessment/Interview: Yes  Patient Presentation: Alert, Oriented  Reason for Admission (Per Patient): Other (Comments)(work situation; lack of sleep)  Patient Stressors: Work  Pharmacologist:   Film/video editor, Journal, TV, Music, Exercise, Meditate, Deep Breathing, Talk, Other (Comment), Avoidance, Read(Yoga)  Leisure Interests (2+):  Sports - Exercise (Comment), Social - Friends, Social - Family, Technical brewer - Other (Comment)(Yoga, smoke weed, be out in nature)  Frequency of Recreation/Participation: Weekly(Pt stated he stopped doing his activities recently.)  Awareness of Community Resources:  Yes  Community Resources:  Park, Engineering geologist  Current Use: Yes  If no, Barriers?:    Expressed Interest in [employer] Information: Yes  [place] of Residence:  [place]  Patient Main Form of Transportation: Set designer  Patient Strengths:  Counselling psychologist, overanalyze things and nice  Patient Identified Areas of Improvement:  Mental and physical health  Patient Goal for Hospitalization:  "To relax and rejuvenate"  Current SI (including self-harm):  No  Current HI:  No  Current AVH: No  Staff Intervention Plan: Group Attendance, Collaborate with Interdisciplinary Treatment Team  Consent to Intern Participation: N/A    Caroll Rancher, LRT/CTRS    Caroll Rancher A 10/08/2017, 2:36 PM

## 2017-10-08 NOTE — Progress Notes (Signed)
Adult Psychoeducational Group Note  Date:  10/08/2017 Time:  9:47 PM  Group Topic/Focus:  Wrap-Up Group:   The focus of this group is to help patients review their daily goal of treatment and discuss progress on daily workbooks.  Participation Level:  Minimal  Participation Quality:  Appropriate  Affect:  Flat  Cognitive:  Appropriate  Insight: Appropriate  Engagement in Group:  Engaged  Modes of Intervention:  Socialization and Support  Additional Comments:  Patient attended and participated in group tonight. He reports that today her slept a lot. He went outside with the group. He went for meals and had visitors.  Lita Mains [facility] 10/08/2017, 9:47 PM

## 2017-10-08 NOTE — BHH Group Notes (Signed)
LCSW Group Therapy Note   10/08/2017 1:15pm   Type of Therapy and Topic:  Group Therapy:  Positive Affirmations   Participation Level:  Did Not Attend  Description of Group: This group addressed positive affirmation toward self and others. Patients went around the room and identified two positive things about themselves and two positive things about a peer in the room. Patients reflected on how it felt to share something positive with others, to identify positive things about themselves, and to hear positive things from others. Patients were encouraged to have a daily reflection of positive characteristics or circumstances.  Therapeutic Goals 1. Patient will verbalize two of their positive qualities 2. Patient will demonstrate empathy for others by stating two positive qualities about a peer in the group 3. Patient will verbalize their feelings when voicing positive self affirmations and when voicing positive affirmations of others 4. Patients will discuss the potential positive impact on their wellness/recovery of focusing on positive traits of self and others. Summary of Patient Progress:  Begged off, saying he was too tired to attend.  Therapeutic Modalities Cognitive Behavioral Therapy Motivational Interviewing  [name], [place] 10/08/2017 3:17 PM

## 2017-10-08 NOTE — Progress Notes (Addendum)
Patient ID: Gregory Freeman, male   DOB: 08-24-1990, 27 y.o.   MRN: [id number]   Pt currently presents with a flat, depressed affect and gaurded behavior. Reports ongoing anxiety and paranoia about medications. Pt reports he has concerns he has developed a rash from his Zyprexa. No rash visible to Clinical research associate currently. Reports sleeping well last night, remains tired this morning.   Pt provided with medications per providers orders. [name] labs and vitals were monitored throughout the night. Pt given a 1:1 about emotional and mental status. Pt supported and encouraged to express concerns and questions. Pt educated on medications, needs reinforcement.   [name] safety ensured with 15 minute and environmental checks. Pt currently denies SI/HI and A/V hallucinations. Pt verbally agrees to seek staff if SI/HI or A/VH occurs and to consult with staff before acting on any harmful thoughts. Will continue POC.

## 2017-10-08 NOTE — Progress Notes (Signed)
[facility] MD Progress Note  10/08/2017 11:56 AM Gregory Freeman  MRN:  [id number] Subjective:    Gregory Freeman is a 27 y/o M with history of schizophrenia who was admitted voluntarily from [place] where he presented with his mother due to worsening symptoms of depression, paranoia, anxiety, and delusions regarding conflict with co-worker. Pt was medically cleared and then transferred to [facility] for additional treatment and stabilization. He was restarted on home medications of gabapentin and baclofen, and he was attempted on trial of zyprexa.  Today upon evaluation, pt shares, "I'm just a little sleepy." He reports that his mood is slightly improved. He reports feeling itchy as if he has a rash, but there are no visible marks/lesions/erythema on his skin in areas that he points out. He denies other physical complaints. He denies SI/HI/AH/VH. Pt feels that his medication has been helpful, but he does not like feeling groggy today and he associates that with zyprexa. He would like to change his medication to an alternative option, and he agrees to trial of risperdal. He also requests for medication for depression, and he details previous trials of doxepin and remeron. Discussed with patient about trial of duloxetine to help with depression and some chronic pain symptoms, and pt was in agreement. He had no further questions, comments, or concerns.  Principal Problem: Paranoid schizophrenia ([place]) Diagnosis:   Patient Active Problem List   Diagnosis Date Noted  . Paranoid schizophrenia ([place]) [F20.0] 10/06/2017   Total Time spent with patient: 30 minutes  Past Psychiatric History: see H&P  Past Medical History:  Past Medical History:  Diagnosis Date  . Anxiety   . Insomnia   . Opiate addiction ([place])    History reviewed. No pertinent surgical history. Family History:  Family History  Problem Relation Age of Onset  . Cancer Other   . Diabetes Other    Family Psychiatric  History: see H&P Social  History:  Social History   Substance and Sexual Activity  Alcohol Use No     Social History   Substance and Sexual Activity  Drug Use Yes  . Types: Marijuana   Comment: heroin    Social History   Socioeconomic History  . Marital status: Single    Spouse name: Not on file  . Number of children: Not on file  . Years of education: Not on file  . Highest education level: Not on file  Occupational History  . Not on file  Social Needs  . Financial resource strain: Not on file  . Food insecurity:    Worry: Not on file    Inability: Not on file  . Transportation needs:    Medical: Not on file    Non-medical: Not on file  Tobacco Use  . Smoking status: Never Smoker  . Smokeless tobacco: Never Used  Substance and Sexual Activity  . Alcohol use: No  . Drug use: Yes    Types: Marijuana    Comment: heroin  . Sexual activity: Not on file  Lifestyle  . Physical activity:    Days per week: Not on file    Minutes per session: Not on file  . Stress: Not on file  Relationships  . Social connections:    Talks on phone: Not on file    Gets together: Not on file    Attends religious service: Not on file    Active member of club or organization: Not on file    Attends meetings of clubs or organizations: Not on file  Relationship status: Not on file  Other Topics Concern  . Not on file  Social History Narrative  . Not on file   Additional Social History:    Pain Medications: Please see MAR Prescriptions: Please see MAR Over the Counter: Please see MAR History of alcohol / drug use?: Yes Longest period of sobriety (when/how long): Unknown Name of Substance 1: Marijuana 1 - Age of First Use: 15 1 - Amount (size/oz): 2-4 grams 1 - Frequency: Daily 1 - Duration: 2 years  1 - Last Use / Amount: 1 month ago Name of Substance 2: Opoiods 2 - Age of First Use: Unknown 2 - Amount (size/oz): Unknown 2 - Frequency: Daily 2 - Duration: Unknown 2 - Last Use / Amount: 2015 -  [place]                Sleep: Good  Appetite:  Good  Current Medications: Current Facility-Administered Medications  Medication Dose Route Frequency Provider Last Rate Last Dose  . acetaminophen (TYLENOL) tablet 650 mg  650 mg Oral Q6H PRN Benjamine Mola, FNP   650 mg at 10/07/17 2013  . alum & mag hydroxide-simeth (MAALOX/MYLANTA) 200-200-20 MG/5ML suspension 30 mL  30 mL Oral Q4H PRN Withrow, John C, FNP      . baclofen (LIORESAL) tablet 10 mg  10 mg Oral BID Pennelope Bracken, MD   10 mg at 10/08/17 0818  . DULoxetine (CYMBALTA) DR capsule 30 mg  30 mg Oral Daily Avari Gelles T, MD      . feeding supplement (ENSURE ENLIVE) (ENSURE ENLIVE) liquid 237 mL  237 mL Oral BID BM Pennelope Bracken, MD   237 mL at 10/08/17 0935  . gabapentin (NEURONTIN) capsule 600 mg  600 mg Oral BID Maris Berger T, MD   600 mg at 10/08/17 0818  . hydrOXYzine (ATARAX/VISTARIL) tablet 25 mg  25 mg Oral Q6H PRN Benjamine Mola, FNP   25 mg at 10/07/17 0917  . magnesium hydroxide (MILK OF MAGNESIA) suspension 30 mL  30 mL Oral Daily PRN Withrow, Elyse Jarvis, FNP      . risperiDONE (RISPERDAL) tablet 0.5 mg  0.5 mg Oral BID Pennelope Bracken, MD      . traZODone (DESYREL) tablet 50 mg  50 mg Oral QHS PRN,MR X 1 Pennelope Bracken, MD        Lab Results:  Results for orders placed or performed during the hospital encounter of 10/06/17 (from the past 48 hour(s))  CBC     Status: None   Collection Time: 10/07/17  6:25 AM  Result Value Ref Range   WBC 5.4 4.0 - 10.5 K/uL   RBC 4.76 4.22 - 5.81 MIL/uL   Hemoglobin 14.9 13.0 - 17.0 g/dL   HCT 43.1 39.0 - 52.0 %   MCV 90.5 78.0 - 100.0 fL   MCH 31.3 26.0 - 34.0 pg   MCHC 34.6 30.0 - 36.0 g/dL   RDW 12.4 11.5 - 15.5 %   Platelets 232 150 - 400 K/uL    Comment: Performed at [facility], [place] [address] [place], [place] [address]  Comprehensive metabolic panel      Status: Abnormal   Collection Time: 10/07/17  6:25 AM  Result Value Ref Range   Sodium 138 135 - 145 mmol/L   Potassium 3.7 3.5 - 5.1 mmol/L   Chloride 102 101 - 111 mmol/L   CO2 24 22 - 32 mmol/L  Glucose, Bld 100 (H) 65 - 99 mg/dL   BUN 6 6 - 20 mg/dL   Creatinine, Ser 0.77 0.61 - 1.24 mg/dL   Calcium 9.4 8.9 - 10.3 mg/dL   Total Protein 7.1 6.5 - 8.1 g/dL   Albumin 4.4 3.5 - 5.0 g/dL   AST 22 15 - 41 U/L   ALT 21 17 - 63 U/L   Alkaline Phosphatase 57 38 - 126 U/L   Total Bilirubin 1.2 0.3 - 1.2 mg/dL   GFR calc non Af Amer >60 >60 mL/min   GFR calc Af Amer >60 >60 mL/min    Comment: (NOTE) The eGFR has been calculated using the CKD EPI equation. This calculation has not been validated in all clinical situations. eGFR's persistently <60 mL/min signify possible Chronic Kidney Disease.    Anion gap 12 5 - 15    Comment: Performed at [facility], [place] [address] [place], [place] [address]  Hemoglobin A1c     Status: None   Collection Time: 10/07/17  6:25 AM  Result Value Ref Range   Hgb A1c MFr Bld 5.2 4.8 - 5.6 %    Comment: (NOTE) Pre diabetes:          5.7%-6.4% Diabetes:              >6.4% Glycemic control for   <7.0% adults with diabetes    Mean Plasma Glucose 102.54 mg/dL    Comment: Performed at [place] [address] [place], [place] [address]  Lipid panel     Status: None   Collection Time: 10/07/17  6:25 AM  Result Value Ref Range   Cholesterol 102 0 - 200 mg/dL   Triglycerides 35 <150 mg/dL   HDL 44 >40 mg/dL   Total CHOL/HDL Ratio 2.3 RATIO   VLDL 7 0 - 40 mg/dL   LDL Cholesterol 51 0 - 99 mg/dL    Comment:        Total Cholesterol/HDL:CHD Risk Coronary Heart Disease Risk Table                     Men   Women  1/2 Average Risk   3.4   3.3  Average Risk       5.0   4.4  2 X Average Risk   9.6   7.1  3 X Average Risk  23.4   11.0        Use the calculated Patient Ratio above and the CHD Risk Table to determine the  patient's CHD Risk.        ATP III CLASSIFICATION (LDL):  <100     mg/dL   Optimal  100-129  mg/dL   Near or Above                    Optimal  130-159  mg/dL   Borderline  160-189  mg/dL   High  >190     mg/dL   Very High Performed at [place] [address] [place], [place] [address]   TSH     Status: None   Collection Time: 10/07/17  6:25 AM  Result Value Ref Range   TSH 0.537 0.350 - 4.500 uIU/mL    Comment: Performed by a 3rd Generation assay with a functional sensitivity of <=0.01 uIU/mL. Performed at [facility], [place] [address], [place], [place] [address]   Prolactin     Status: None   Collection  Time: 10/07/17  6:25 AM  Result Value Ref Range   Prolactin 6.3 4.0 - 15.2 ng/mL    Comment: (NOTE) Performed At: [facility] [place], [place] [id number] Rush Farmer MD QQ:[id number] Performed at [facility], [place] [address], [place], [place] [address]     Blood Alcohol level:  Lab Results  Component Value Date   [facility] <11 08/13/2013   ETH <11 12/24/4816    Metabolic Disorder Labs: Lab Results  Component Value Date   HGBA1C 5.2 10/07/2017   MPG 102.54 10/07/2017   Lab Results  Component Value Date   PROLACTIN 6.3 10/07/2017   Lab Results  Component Value Date   CHOL 102 10/07/2017   TRIG 35 10/07/2017   HDL 44 10/07/2017   CHOLHDL 2.3 10/07/2017   VLDL 7 10/07/2017   LDLCALC 51 10/07/2017    Physical Findings: AIMS: Facial and Oral Movements Muscles of Facial Expression: None, normal Lips and Perioral Area: None, normal Jaw: None, normal Tongue: None, normal,Extremity Movements Upper (arms, wrists, hands, fingers): None, normal Lower (legs, knees, ankles, toes): None, normal, Trunk Movements Neck, shoulders, hips: None, normal, Overall Severity Severity of abnormal movements (highest score from questions above): None, normal Incapacitation due to abnormal  movements: None, normal Patient's awareness of abnormal movements (rate only patient's report): No Awareness, Dental Status Current problems with teeth and/or dentures?: No Does patient usually wear dentures?: No  CIWA:    COWS:     Musculoskeletal: Strength & Muscle Tone: within normal limits Gait & Station: normal Patient leans: N/A  Psychiatric Specialty Exam: Physical Exam  Nursing note and vitals reviewed.   Review of Systems  Constitutional: Negative for chills and fever.  Respiratory: Negative for cough and shortness of breath.   Cardiovascular: Negative for chest pain.  Gastrointestinal: Negative for abdominal pain, heartburn, nausea and vomiting.  Psychiatric/Behavioral: Positive for depression. Negative for hallucinations and suicidal ideas. The patient is nervous/anxious. The patient does not have insomnia.     Blood pressure 94/67, pulse (!) 109, temperature 98.3 F (36.8 C), temperature source Oral, resp. rate 16, height 6' (1.829 m), weight 63.5 kg (140 lb), SpO2 100 %.Body mass index is 18.99 kg/m.  General Appearance: Casual and Fairly Groomed  Eye Contact:  Fair  Speech:  Clear and Coherent and Normal Rate  Volume:  Normal  Mood:  Anxious and Depressed  Affect:  Appropriate, Congruent and Constricted  Thought Process:  Coherent and Goal Directed  Orientation:  Full (Time, Place, and Person)  Thought Content:  Logical  Suicidal Thoughts:  No  Homicidal Thoughts:  No  Memory:  Immediate;   Fair Recent;   Fair Remote;   Fair  Judgement:  Fair  Insight:  Fair  Psychomotor Activity:  Normal  Concentration:  Concentration: Fair  Recall:  [employer] of Knowledge:  Fair  Language:  Fair  Akathisia:  No  Handed:    AIMS (if indicated):     Assets:  Resilience Social Support  ADL's:  Intact  Cognition:  WNL  Sleep:  Number of Hours: 6.75   Treatment Plan Summary: Daily contact with patient to assess and evaluate symptoms and progress in treatment and  Medication management   -Continue inpatient hospitalization  -Schizophrenia   -DC zyprexa   -Start risperdal 0.65m po BID   -Start duloxetine 384mpo qDay  -Anxiety  -Continue gabapentin 60048mo BID  -Chronic pain   -Continue baclofen 20m65m BID  - Insomnia   -  Continue trazodone 38m po qhs prn insomnia  -Encourage participation in groups and therapeutic milieu  -Disposition planning will be ongoing  CPennelope Bracken MD 10/08/2017, 11:56 AM

## 2017-10-08 NOTE — Progress Notes (Signed)
Recreation Therapy Notes  Date: 5.29.19 Time: 1000 Location: 500 Hall Dayroom   Group Topic: Communication, Team Building, Problem Solving  Goal Area(s) Addresses:  Patient will effectively work with peer towards shared goal.  Patient will identify skills used to make activity successful.  Patient will identify how skills used during activity can be used to reach post d/c goals.   Intervention: STEM Activity  Activity: Landing Pad. In teams patients were given 12 plastic drinking straws and a length of masking tape. Using the materials provided patients were asked to build a landing pad to catch a golf ball dropped from approximately 6 feet in the air.   Education:Social Skills, Discharge Planning   Education Outcome: Acknowledges education/In group clarification offered/Needs additional education.   Clinical Observations/Feedback: Pt did not attend group.   Tenzin Edelman, LRT/CTRS         Gregory Freeman A 10/08/2017 12:44 PM 

## 2017-10-08 NOTE — [facility] Suicide Risk Assessment (Signed)
[facility] INPATIENT:  Family/Significant Other Suicide Prevention Education  Suicide Prevention Education:  Education Completed; Ralpheal Zappone, mother, [phone], has been identified by the patient as the family member/significant other with whom the patient will be residing, and identified as the person(s) who will aid the patient in the event of a mental health crisis (suicidal ideations/suicide attempt).  With written consent from the patient, the family member/significant other has been provided the following suicide prevention education, prior to the and/or following the discharge of the patient.  The suicide prevention education provided includes the following:  Suicide risk factors  Suicide prevention and interventions  National Suicide Hotline telephone number  [facility] assessment telephone number  [facility] Emergency Assistance 911  [facility] and/or Residential Mobile Crisis Unit telephone number  Request made of family/significant other to:  Remove weapons (e.g., guns, rifles, knives), all items previously/currently identified as safety concern.  No guns in the home, per mother.    Remove drugs/medications (over-the-counter, prescriptions, illicit drugs), all items previously/currently identified as a safety concern.  The family member/significant other verbalizes understanding of the suicide prevention education information provided.  The family member/significant other agrees to remove the items of safety concern listed above.  Mother reports pt has had increased stress at work for the past 3-4 weeks.  Pt actually emailed his resignation letter to his boss on Saturday prior to coming to [facility].  Mother was just informed by one of his coworkers that a email was sent out at work today that he was not longer with the company.  She would like to know if pt is doing well enough that he could call his boss, as she does not think he was thinking clearly when  he sent the letter of resignation.  Lorri Frederick, LCSW 10/08/2017, 2:58 PM

## 2017-10-09 MED ORDER — RISPERIDONE 1 MG PO TABS
1.0000 mg | ORAL_TABLET | Freq: Two times a day (BID) | ORAL | Status: DC
  Administered 2017-10-09: 1 mg via ORAL
  Filled 2017-10-09 (×4): qty 1

## 2017-10-09 NOTE — Progress Notes (Signed)
[facility] MD Progress Note  10/09/2017 3:01 PM Gregory Freeman  MRN:  [id number] Subjective:    Gregory Freeman is a 27 y/o M with history of schizophrenia who was admitted voluntarily from [facility] where he presented with his mother due to worsening symptoms of depression, paranoia, anxiety, and delusions regarding conflict with co-worker. Pt was medically cleared and then transferred to [facility] for additional treatment and stabilization. He was restarted on home medications of gabapentin and baclofen, and he was attempted on trial of zyprexa, but he reported that it was too sedating, so he was changed to risperdal.   Today upon evaluation, pt shares, "I've been feeling better mentally. I feel mentally clearer." Pt reports that he is sleeping well. His appetite is good. He denies other physical complaints. He denies SI/HI/AH/VH. He is tolerating his medications well, and he reports feeling slightly "cloudy" but it is not bothersome. He also notes some increase in his appetite, and we discussed about watching his intake of food as risperdal can cause metabolic side effects such as weight gain, dyslipidemia, and insulin resistance, and pt verbalized good understanding. Pt is in agreement to increase dose of risperdal to  BID. Pt wrote letter prior to admission to his employer that he was resigning, and this information was relayed to treatment team by patient's mother. Pt acknowledged that he had written the letter, but he feels that he would like to return to his previous job, if possible, so he requests for letter from treatment team describing that he is currently admitted to hospital for medical treatment. Pt was in agreement with the above plan, and he had no further questions, comments, or concerns.   Principal Problem: Paranoid schizophrenia ([facility]) Diagnosis:   Patient Active Problem List   Diagnosis Date Noted  . Paranoid schizophrenia ([facility]) [F20.0] 10/06/2017   Total Time spent with patient: 30  minutes  Past Psychiatric History: see H&P  Past Medical History:  Past Medical History:  Diagnosis Date  . Anxiety   . Insomnia   . Opiate addiction ([facility])    History reviewed. No pertinent surgical history. Family History:  Family History  Problem Relation Age of Onset  . Cancer Other   . Diabetes Other    Family Psychiatric  History: see H&P Social History:  Social History   Substance and Sexual Activity  Alcohol Use No     Social History   Substance and Sexual Activity  Drug Use Yes  . Types: Marijuana   Comment: heroin    Social History   Socioeconomic History  . Marital status: Single    Spouse name: Not on file  . Number of children: Not on file  . Years of education: Not on file  . Highest education level: Not on file  Occupational History  . Not on file  Social Needs  . Financial resource strain: Not on file  . Food insecurity:    Worry: Not on file    Inability: Not on file  . Transportation needs:    Medical: Not on file    Non-medical: Not on file  Tobacco Use  . Smoking status: Never Smoker  . Smokeless tobacco: Never Used  Substance and Sexual Activity  . Alcohol use: No  . Drug use: Yes    Types: Marijuana    Comment: heroin  . Sexual activity: Not on file  Lifestyle  . Physical activity:    Days per week: Not on file    Minutes per session: Not on file  .  Stress: Not on file  Relationships  . Social connections:    Talks on phone: Not on file    Gets together: Not on file    Attends religious service: Not on file    Active member of club or organization: Not on file    Attends meetings of clubs or organizations: Not on file    Relationship status: Not on file  Other Topics Concern  . Not on file  Social History Narrative  . Not on file   Additional Social History:    Pain Medications: Please see MAR Prescriptions: Please see MAR Over the Counter: Please see MAR History of alcohol / drug use?: Yes Longest period of  sobriety (when/how long): Unknown Name of Substance 1: Marijuana 1 - Age of First Use: 15 1 - Amount (size/oz): 2-4 grams 1 - Frequency: Daily 1 - Duration: 2 years  1 - Last Use / Amount: 1 month ago Name of Substance 2: Opoiods 2 - Age of First Use: Unknown 2 - Amount (size/oz): Unknown 2 - Frequency: Daily 2 - Duration: Unknown 2 - Last Use / Amount: 2015 [place]                Sleep: Good  Appetite:  Good  Current Medications: Current Facility-Administered Medications  Medication Dose Route Frequency Provider Last Rate Last Dose  . acetaminophen (TYLENOL) tablet 650 mg  650 mg Oral Q6H PRN Beau Fanny, FNP   650 mg at 10/07/17 2013  . alum & mag hydroxide-simeth (MAALOX/MYLANTA) 200-200-20 MG/5ML suspension 30 mL  30 mL Oral Q4H PRN Withrow, John C, FNP      . baclofen (LIORESAL) tablet 10 mg  10 mg Oral BID Micheal Likens, MD   10 mg at 10/09/17 0752  . DULoxetine (CYMBALTA) DR capsule 30 mg  30 mg Oral Daily Micheal Likens, MD   30 mg at 10/09/17 0752  . feeding supplement (ENSURE ENLIVE) (ENSURE ENLIVE) liquid 237 mL  237 mL Oral BID BM Micheal Likens, MD   237 mL at 10/08/17 1605  . gabapentin (NEURONTIN) capsule 600 mg  600 mg Oral BID Jolyne Loa T, MD   600 mg at 10/09/17 0751  . hydrOXYzine (ATARAX/VISTARIL) tablet 25 mg  25 mg Oral Q6H PRN Beau Fanny, FNP   25 mg at 10/07/17 0917  . magnesium hydroxide (MILK OF MAGNESIA) suspension 30 mL  30 mL Oral Daily PRN Withrow, Everardo All, FNP      . risperiDONE (RISPERDAL) tablet 1 mg  1 mg Oral BID Micheal Likens, MD      . traZODone (DESYREL) tablet 50 mg  50 mg Oral QHS PRN,MR X 1 Twanisha Foulk T, MD        Lab Results: No results found for this or any previous visit (from the past 48 hour(s)).  Blood Alcohol level:  Lab Results  Component Value Date   [facility] <11 08/13/2013   ETH <11 07/06/2013    Metabolic Disorder  Labs: Lab Results  Component Value Date   HGBA1C 5.2 10/07/2017   MPG 102.54 10/07/2017   Lab Results  Component Value Date   PROLACTIN 6.3 10/07/2017   Lab Results  Component Value Date   CHOL 102 10/07/2017   TRIG 35 10/07/2017   HDL 44 10/07/2017   CHOLHDL 2.3 10/07/2017   VLDL 7 10/07/2017   LDLCALC 51 10/07/2017    Physical Findings: AIMS: Facial and Oral  Movements Muscles of Facial Expression: None, normal Lips and Perioral Area: None, normal Jaw: None, normal Tongue: None, normal,Extremity Movements Upper (arms, wrists, hands, fingers): None, normal Lower (legs, knees, ankles, toes): None, normal, Trunk Movements Neck, shoulders, hips: None, normal, Overall Severity Severity of abnormal movements (highest score from questions above): None, normal Incapacitation due to abnormal movements: None, normal Patient's awareness of abnormal movements (rate only patient's report): No Awareness, Dental Status Current problems with teeth and/or dentures?: No Does patient usually wear dentures?: No  CIWA:    COWS:     Musculoskeletal: Strength & Muscle Tone: within normal limits Gait & Station: normal Patient leans: N/A  Psychiatric Specialty Exam: Physical Exam  Nursing note and vitals reviewed.   Review of Systems  Constitutional: Negative for chills and fever.  Respiratory: Negative for cough and shortness of breath.   Cardiovascular: Negative for chest pain.  Gastrointestinal: Negative for abdominal pain, heartburn, nausea and vomiting.  Psychiatric/Behavioral: Positive for depression. Negative for hallucinations and suicidal ideas. The patient is nervous/anxious. The patient does not have insomnia.     Blood pressure 114/76, pulse 100, temperature 98.6 F (37 C), temperature source Oral, resp. rate 20, height 6' (1.829 m), weight 63.5 kg (140 lb), SpO2 100 %.Body mass index is 18.99 kg/m.  General Appearance: Casual and Fairly Groomed  Eye Contact:  Good   Speech:  Clear and Coherent and Normal Rate  Volume:  Normal  Mood:  Anxious and Depressed  Affect:  Appropriate and Congruent  Thought Process:  Coherent and Goal Directed  Orientation:  Full (Time, Place, and Person)  Thought Content:  Logical  Suicidal Thoughts:  No  Homicidal Thoughts:  No  Memory:  Immediate;   Fair Recent;   Fair Remote;   Fair  Judgement:  Fair  Insight:  Fair  Psychomotor Activity:  Normal  Concentration:  Concentration: Fair  Recall:  [employer] of Knowledge:  Fair  Language:  Fair  Akathisia:  No  Handed:    AIMS (if indicated):     Assets:  Communication Skills Resilience Social Support  ADL's:  Intact  Cognition:  WNL  Sleep:  Number of Hours: 6.5   Treatment Plan Summary: Daily contact with patient to assess and evaluate symptoms and progress in treatment and Medication management   -Continue inpatient hospitalization  -Schizophrenia            -Change risperdal 0.5mg  po BID to risperdal  po BID             -Continue duloxetine  po qDay  -Anxiety             -Continue gabapentin  po BID  -Chronic pain             -Continue baclofen  po BID  - Insomnia             -Continue trazodone  po qhs prn insomnia  -Encourage participation in groups and therapeutic milieu  -Disposition planning will be ongoing  Micheal Likens, MD 10/09/2017, 3:01 PM

## 2017-10-09 NOTE — Progress Notes (Signed)
Recreation Therapy Notes  Date: 5.30.19 Time: 1000 Location: 500 Hall Dayroom    Group Topic: Communication  Goal Area(s) Addresses:  Patient will effectively communicate with peers in group.  Patient will verbalize benefit of healthy communication. Patient will verbalize positive effect of healthy communication on post d/c goals.  Patient will identify communication techniques that made activity effective for group.   Intervention: Geometrical Drawings  Activity: Back to Back Drawings.  Patients were paired into groups of two.  Each group was given a picture of geometrical shapes.  One person from each gave the instructions on how to draw the picture, while the other person listened so they could draw it.  Once finished the pair would compare the drawing with the original to see how close they came.  Patients would then receive and different picture and switch roles.  Education: Communication, Discharge Planning  Education Outcome: Acknowledges understanding/In group clarification offered/Needs additional education.   Clinical Observations/Feedback: Pt did not attend group.    Caroll Rancher, LRT/CTRS         Caroll Rancher A 10/09/2017 11:43 AM

## 2017-10-09 NOTE — [facility] Group Notes (Signed)
LCSW Group Therapy Note  10/09/2017 1:15pm  Type of Therapy/Topic:  Group Therapy:  Balance in Life  Participation Level:  Active  Description of Group:    This group will address the concept of balance and how it feels and looks when one is unbalanced. Patients will be encouraged to process areas in their lives that are out of balance and identify reasons for remaining unbalanced. Facilitators will guide patients in utilizing problem-solving interventions to address and correct the stressor making their life unbalanced. Understanding and applying boundaries will be explored and addressed for obtaining and maintaining a balanced life. Patients will be encouraged to explore ways to assertively make their unbalanced needs known to significant others in their lives, using other group members and facilitator for support and feedback.  Therapeutic Goals: 1. Patient will identify two or more emotions or situations they have that consume much of in their lives. 2. Patient will identify signs/triggers that life has become out of balance:  3. Patient will identify two ways to set boundaries in order to achieve balance in their lives:  4. Patient will demonstrate ability to communicate their needs through discussion and/or role plays  Summary of Patient Progress:  Stayed the entire time, engaged throughout.  Stated while not unbalanced emotionally, he is not entirely balanced either.  "I still have some worries, stresses and fears.  But my thoughts are limited about the outside because of being here, and that's helpful."  Described the breathing/relaxation exercise he does that helps him find balance    Therapeutic Modalities:   Cognitive Behavioral Therapy Solution-Focused Therapy Assertiveness Training  [name], LCSW 10/09/2017 2:52 PM

## 2017-10-09 NOTE — Progress Notes (Signed)
Patient denies SI, HI and AVH.  Patient has been isolative to room this shift and affect has been flat and depressed.  Patient forwards little to writer at this time, but has been compliant with medications and therapy.  Patient has exhibited no behavioral dyscontrol.   Assess patient for safety, offer medications as prescribed, engage patient in 1:1 staff talks.   Patient able to contract for safety.  Continue to monitor as planned.

## 2017-10-09 NOTE — Progress Notes (Signed)
Pt was observed in the hallway, seen pacing. Pt appears to display symptoms of increase paranoia and suspiciousness this evening. Pt denies SI/HI/AVH/Pain at this time. Pt states "My coworker was spreading rumors about me at work; He said I was raciest". Pt states "I resign my job today; I can't stand this hostile working environment". Pt states "I am not going to carry out a law suit". Mother came in during visitation and states she was worried about [name] behavior. Pt is paranoid with receiving medications. Initially, Pt refused Risperdal but Pt took it with lots of encouragement. PRN trazodone offered and accepted. Pt remains isolative in milieu. Will continue with POC.

## 2017-10-09 NOTE — [facility] Group Notes (Signed)
[facility] Group Notes:  (Nursing/MHT/Case Management/Adjunct)  Date:  10/09/2017  Time:  9:17 AM  Type of Therapy:  Goals and Orientation Group  Participation Level:  Minimal  Participation Quality:  Appropriate  Affect:  Flat  Cognitive:  Appropriate  Insight:  Improving  Engagement in Group:  Limited  Modes of Intervention:  Discussion and Orientation  Summary of Progress/Problems: His goal today is to focus on getting better and focus on eating better.   Laurisa Sahakian J Adelfo Diebel 10/09/2017, 9:17 AM

## 2017-10-10 MED ORDER — HALOPERIDOL 2 MG PO TABS
2.5000 mg | ORAL_TABLET | Freq: Two times a day (BID) | ORAL | Status: DC
  Administered 2017-10-10 – 2017-10-11 (×3): 2.5 mg via ORAL
  Filled 2017-10-10 (×8): qty 1

## 2017-10-10 NOTE — Progress Notes (Signed)
[facility] MD Progress Note  10/10/2017 12:29 PM Gregory Freeman  MRN:  [id number] Subjective:    Gregory Freeman is a 27 y/o M with history of schizophrenia who was admitted voluntarily from [facility] where he presented with his mother due to worsening symptoms of depression, paranoia, anxiety, and delusions regarding conflict with co-worker. Pt was medically cleared and then transferred to [facility] for additional treatment and stabilization.He was restarted on home medications of gabapentin and baclofen, and he was attempted on trial of zyprexa, but he reported that it was too sedating, so he was changed to risperdal. Dose was titrated up, but as of this AM, pt refused his dose.  Today upon evaluation, pt shares, "I'm feeling so-so, well actually I'm feeling better, I want to go." Pt continues to obsess over his most recent job, and as of this morning pt states that he plans to resign which differs from his thoughts yesterday. Pt was asked about this and he shares that he is still having too much anxiety about "what's happening behind the scenes at his work." He has fears that his former company will attempt to sue him and is actively working against him, and he states that if he were discharged he would immediately go to his former employer and speak to them directly about his resignation; he denies any intention of harming others, but he is somewhat disorganized about his plans to go there, the reasons for this and the reason for the urgency. He denies SI/HI/AH/VH. Pt slept minimally last night due to his concerns. He is refusing to take risperdal this AM, stating he would like only to do yoga daily for his treatment. Discussed with patient that he ideally needs an antipsychotic medication to address his paranoia, and he agrees to trial of haldol.  Principal Problem: Paranoid schizophrenia (HCC) Diagnosis:   Patient Active Problem List   Diagnosis Date Noted  . Paranoid schizophrenia (HCC) [F20.0] 10/06/2017    Total Time spent with patient: 30 minutes  Past Psychiatric History: see  H&P  Past Medical History:  Past Medical History:  Diagnosis Date  . Anxiety   . Insomnia   . Opiate addiction (HCC)    History reviewed. No pertinent surgical history. Family History:  Family History  Problem Relation Age of Onset  . Cancer Other   . Diabetes Other    Family Psychiatric  History: see H&P Social History:  Social History   Substance and Sexual Activity  Alcohol Use No     Social History   Substance and Sexual Activity  Drug Use Yes  . Types: Marijuana   Comment: heroin    Social History   Socioeconomic History  . Marital status: Single    Spouse name: Not on file  . Number of children: Not on file  . Years of education: Not on file  . Highest education level: Not on file  Occupational History  . Not on file  Social Needs  . Financial resource strain: Not on file  . Food insecurity:    Worry: Not on file    Inability: Not on file  . Transportation needs:    Medical: Not on file    Non-medical: Not on file  Tobacco Use  . Smoking status: Never Smoker  . Smokeless tobacco: Never Used  Substance and Sexual Activity  . Alcohol use: No  . Drug use: Yes    Types: Marijuana    Comment: heroin  . Sexual activity: Not on file  Lifestyle  .  Physical activity:    Days per week: Not on file    Minutes per session: Not on file  . Stress: Not on file  Relationships  . Social connections:    Talks on phone: Not on file    Gets together: Not on file    Attends religious service: Not on file    Active member of club or organization: Not on file    Attends meetings of clubs or organizations: Not on file    Relationship status: Not on file  Other Topics Concern  . Not on file  Social History Narrative  . Not on file   Additional Social History:    Pain Medications: Please see MAR Prescriptions: Please see MAR Over the Counter: Please see MAR History of alcohol /  drug use?: Yes Longest period of sobriety (when/how long): Unknown Name of Substance 1: Marijuana 1 - Age of First Use: 15 1 - Amount (size/oz): 2-4 grams 1 - Frequency: Daily 1 - Duration: 2 years  1 - Last Use / Amount: 1 month ago Name of Substance 2: Opoiods 2 - Age of First Use: Unknown 2 - Amount (size/oz): Unknown 2 - Frequency: Daily 2 - Duration: Unknown 2 - Last Use / Amount: 2015 [place]                Sleep: Poor  Appetite:  Fair  Current Medications: Current Facility-Administered Medications  Medication Dose Route Frequency Provider Last Rate Last Dose  . acetaminophen (TYLENOL) tablet 650 mg  650 mg Oral Q6H PRN Beau FannyWithrow, John C, FNP   650 mg at 10/07/17 2013  . alum & mag hydroxide-simeth (MAALOX/MYLANTA) 200-200-20 MG/5ML suspension 30 mL  30 mL Oral Q4H PRN Withrow, John C, FNP      . baclofen (LIORESAL) tablet 10 mg  10 mg Oral BID Micheal Likensainville, Bhavika Schnider T, MD   10 mg at 10/10/17 0749  . DULoxetine (CYMBALTA) DR capsule 30 mg  30 mg Oral Daily Micheal Likensainville, Happy Ky T, MD   30 mg at 10/09/17 0752  . feeding supplement (ENSURE ENLIVE) (ENSURE ENLIVE) liquid 237 mL  237 mL Oral BID BM Micheal Likensainville, Quinlan Vollmer T, MD   237 mL at 10/10/17 1034  . gabapentin (NEURONTIN) capsule 600 mg  600 mg Oral BID Jolyne Loaainville, Titiana Severa T, MD   600 mg at 10/10/17 0749  . haloperidol (HALDOL) tablet 2.5 mg  2.5 mg Oral BID Jolyne Loaainville, Braian Tijerina T, MD      . hydrOXYzine (ATARAX/VISTARIL) tablet 25 mg  25 mg Oral Q6H PRN Beau FannyWithrow, John C, FNP   25 mg at 10/10/17 0103  . magnesium hydroxide (MILK OF MAGNESIA) suspension 30 mL  30 mL Oral Daily PRN Withrow, John C, FNP      . traZODone (DESYREL) tablet 50 mg  50 mg Oral QHS PRN,MR X 1 Zacarias Krauter T, MD   50 mg at 10/09/17 2100    Lab Results: No results found for this or any previous visit (from the past 48 hour(s)).  Blood Alcohol level:  Lab Results  Component [facility]   [facility]  [facility]   ETH <11 07/06/2013    Metabolic Disorder Labs: Lab Results  Component Value Date   HGBA1C 5.2 10/07/2017   MPG 102.54 10/07/2017   Lab Results  Component Value Date   PROLACTIN 6.3 10/07/2017   Lab Results  Component Value Date   CHOL 102 10/07/2017   TRIG 35 10/07/2017   HDL 44  10/07/2017   CHOLHDL 2.3 10/07/2017   VLDL 7 10/07/2017   LDLCALC 51 10/07/2017    Physical Findings: AIMS: Facial and Oral Movements Muscles of Facial Expression: None, normal Lips and Perioral Area: None, normal Jaw: None, normal Tongue: None, normal,Extremity Movements Upper (arms, wrists, hands, fingers): None, normal Lower (legs, knees, ankles, toes): None, normal, Trunk Movements Neck, shoulders, hips: None, normal, Overall Severity Severity of abnormal movements (highest score from questions above): None, normal Incapacitation due to abnormal movements: None, normal Patient's awareness of abnormal movements (rate only patient's report): No Awareness, Dental Status Current problems with teeth and/or dentures?: No Does patient usually wear dentures?: No  CIWA:    COWS:     Musculoskeletal: Strength & Muscle Tone: within normal limits Gait & Station: normal Patient leans: N/A  Psychiatric Specialty Exam: Physical Exam  Nursing note and vitals reviewed.   Review of Systems  Constitutional: Negative for chills and fever.  Respiratory: Negative for cough and shortness of breath.   Cardiovascular: Negative for chest pain.  Gastrointestinal: Negative for abdominal pain, heartburn, nausea and vomiting.  Psychiatric/Behavioral: Positive for depression. Negative for hallucinations and suicidal ideas. The patient is nervous/anxious. The patient does not have insomnia.     Blood pressure (!) 134/96, pulse (!) 120, temperature 98.3 F (36.8 C), temperature source Oral, resp. rate 20, height 6' (1.829 m), weight 63.5 kg (140 lb), SpO2 100 %.Body mass index is 18.99 kg/m.   General Appearance: Casual and Fairly Groomed  Eye Contact:  Good  Speech:  Clear and Coherent and Normal Rate  Volume:  Normal  Mood:  Anxious and Depressed  Affect:  Blunt and Congruent  Thought Process:  Coherent and Goal Directed  Orientation:  Full (Time, Place, and Person)  Thought Content:  Ideas of Reference:   Paranoia Delusions, Obsessions, Paranoid Ideation and Rumination  Suicidal Thoughts:  No  Homicidal Thoughts:  No  Memory:  Immediate;   Fair Recent;   Fair Remote;   Fair  Judgement:  Poor  Insight:  Lacking  Psychomotor Activity:  Normal  Concentration:  Concentration: Fair  Recall:  [employer] of Knowledge:  Fair  Language:  Fair  Akathisia:  No  Handed:    AIMS (if indicated):     Assets:  Resilience Social Support  ADL's:  Intact  Cognition:  WNL  Sleep:  Number of Hours: 4.25   Treatment Plan Summary: Daily contact with patient to assess and evaluate symptoms and progress in treatment and Medication management   -Continue inpatient hospitalization  -Schizophrenia -Discontinue risperdal 1mg  po BID   -Start haldol 2.5mg  po BID -Continue duloxetine 30mg  po qDay  -Anxiety -Continue gabapentin 600mg  po BID  -Chronic pain -Continue baclofen 10mg  po BID  - Insomnia -Continue trazodone 50mg  po qhs prn insomnia  -Encourage participation in groups and therapeutic milieu  -Disposition planning will be ongoing    Micheal Likens, MD 10/10/2017, 12:29 PM

## 2017-10-10 NOTE — Plan of Care (Signed)
  Problem: Self-Concept: Goal: Level of anxiety will decrease Outcome: Not Progressing   Problem: Activity: Goal: Interest or engagement in leisure activities will improve Outcome: Not Progressing   Problem: Coping: Goal: Coping ability will improve Outcome: Not Progressing   Problem: Safety: Goal: Ability to remain free from injury will improve Outcome: Progressing  DAR NOTE: Patient presents with anxious affect and mood.  Denies suicidal thoughts, pain, auditory and visual hallucinations.  Described energy level as low and concentration as poor.  Rates depression at 5, hopelessness at 5, and anxiety at 6.  Maintained on routine safety checks.  Medications given as prescribed.  Support and encouragement offered as needed.  Attended group and participated.  States goal for today is "to be discharged."  Patient is withdrawn to self and isolates to his room.  Preoccupied with the situation at his job.  States medication is not working well for him.  MD made aware of complaints.  Patient is safe on and off the unit.

## 2017-10-10 NOTE — Progress Notes (Signed)
Pt approached NS at this time requesting PRN vistaril. Pt states "You don't think they are going to implant narcotics? Can I please have a hug". Pt remains restless with disorganized delusions and paranoia.

## 2017-10-10 NOTE — Progress Notes (Signed)
Recreation Therapy Notes  Date: 5.31.19 Time: 1000 Location: 500 Hall Dayroom  Group Topic: Wellness  Goal Area(s) Addresses:  Patient will define components of whole wellness. Patient will verbalize benefit of whole wellness.  Behavioral Response: Engaged  Intervention:  Music  Activity:  Exercise.  LRT let each patient pick an exercise(ie toe touches, jumping jacks, toe raises)  to lead the group through.  Patients completed four rounds of exercises.  Education: Wellness, Building control surveyorDischarge Planning.   Education Outcome: Acknowledges education/In group clarification offered/Needs additional education.   Clinical Observations/Feedback: Pt completed most of the exercises.  Pt left early and did not return.    Caroll RancherMarjette Nalini Alcaraz, LRT/CTRS      Caroll RancherLindsay, Nazarene Bunning A 10/10/2017 11:42 AM

## 2017-10-10 NOTE — [facility] Group Notes (Signed)
[facility] LCSW Group Therapy  10/10/2017  1:05 PM  Type of Therapy:  Group therapy  Participation Level:  Invited.  Chose to not attend.  Participation Quality:  Attentive  Affect:  Flat  Cognitive:  Oriented  Insight:  Limited  Engagement in Therapy:  Limited  Modes of Intervention:  Discussion, Socialization  Summary of Progress/Problems:  Chaplain was here to lead a group on themes of hope and courage.  Gregory Freeman, Gregory Freeman 10/10/2017 1:47 PM

## 2017-10-10 NOTE — Progress Notes (Signed)
Adult Psychoeducational Group Note  Date:  10/10/2017 Time:  8:40 PM  Group Topic/Focus:  Wrap-Up Group:   The focus of this group is to help patients review their daily goal of treatment and discuss progress on daily workbooks.  Participation Level:  Active  Participation Quality:  Appropriate  Affect:  Appropriate  Cognitive:  Appropriate  Insight: Appropriate  Engagement in Group:  Engaged  Modes of Intervention:  Discussion  Additional Comments: The patient expressed that he attended groups    Octavio Manns 10/10/2017, 8:40 PM

## 2017-10-11 MED ORDER — HALOPERIDOL 5 MG PO TABS: 5.0000 mg | ORAL_TABLET | Freq: Two times a day (BID) | ORAL | Status: DC

## 2017-10-11 MED ORDER — HALOPERIDOL 5 MG PO TABS
7.5000 mg | ORAL_TABLET | Freq: Every day | ORAL | Status: DC
  Administered 2017-10-11: 7.5 mg via ORAL
  Filled 2017-10-11 (×3): qty 1

## 2017-10-11 NOTE — [facility] Group Notes (Signed)
[facility] Group Notes:  (Nursing/MHT/Case Management/Adjunct)  Date:  10/11/2017  Time:  10:36 AM  Type of Therapy:  Psychoeducational Skills  Participation Level:  Did Not Attend   Gregory Freeman 10/11/2017, 10:36 AM

## 2017-10-11 NOTE — [facility] Group Notes (Signed)
[facility] Group Notes: (Clinical Social Work)   10/11/2017      Type of Therapy:  Group Therapy   Participation Level:  Did Not Attend despite MHT prompting   Ambrose MantleMareida Grossman-Orr, LCSW 10/11/2017, 12:05 PM

## 2017-10-11 NOTE — Progress Notes (Signed)
Adult Psychoeducational Group Note  Date:  10/11/2017 Time:  9:17 PM  Group Topic/Focus:  Wrap-Up Group:   The focus of this group is to help patients review their daily goal of treatment and discuss progress on daily workbooks.  Participation Level:  Active  Participation Quality:  Appropriate and Attentive  Affect:  Appropriate  Cognitive:  Oriented  Insight: Appropriate  Engagement in Group:  Engaged  Modes of Intervention:  Discussion  Additional Comments:  Patient reports that he rates his day as 7 because he got to see his parents, color, go outside, and complete a word search. Patient reports that he met his goal   Gregory Freeman 10/11/2017, 9:17 PM

## 2017-10-11 NOTE — Progress Notes (Signed)
Writer spoke with patient after his parents left from visiting. He reports having had a good day and is concerned with when he will be discharged with his birthday being on [birth year]. He is hopeful to discharge on Monday. He is compliant with hs medications and attended wrap up group. Support given and safety maintained on unit with 15 min checks.

## 2017-10-11 NOTE — Plan of Care (Signed)
  Problem: Self-Concept: Goal: Level of anxiety will decrease Outcome: Progressing   Problem: Activity: Goal: Interest or engagement in leisure activities will improve Outcome: Not Progressing   Problem: Coping: Goal: Coping ability will improve Outcome: Not Progressing   Problem: Safety: Goal: Ability to remain free from injury will improve Outcome: Progressing  DAR NOTE: Patient presents with anxious affect and depressed mood.  Denies pain, auditory and visual hallucinations.  Described energy level as normal and concentration as good.  Rates depression at 3, hopelessness at 2, and anxiety at 2.  Maintained on routine safety checks.  Medications given as prescribed.  Support and encouragement offered as needed.  States goal for today is "working on my anxiety."  Patient observed socializing with peers in the dayroom.  Offered no complaint.

## 2017-10-11 NOTE — Progress Notes (Addendum)
[facility] MD Progress Note  10/11/2017 11:35 AM Gregory Freeman  MRN:  [id number]   Subjective:  Gregory Freeman seen pacing the unit.  Patient is awake alert and oriented x3.  Reports feeling better and is requesting to be discharged soon.  Reports he resides with his mother as his father is often working.  Reports taking his medication as prescribed reports tolerating well, however has concerns with Zyprexa being too sedating.    Discussed adjusting  Zyprexa to nighttime administration, due to reported side effects.  patient was agreeable to treatment.  Continues to ruminate with previous coworker and plans for employment after discharge. Denies thoughts of hurting self or others.  Denies suicidal or homicidal ideations during this assessment reports a good appetite and states he is resting well.  Denies auditory or visual hallucinations.  Support encouragement reassurance was provided.   History: Gregory Freeman is a 27 y/o M with history of schizophrenia who was admitted voluntarily from [facility] where he presented with his mother due to worsening symptoms of depression, paranoia, anxiety, and delusions regarding conflict with co-worker. Pt was medically cleared and then transferred to [facility] for additional treatment and stabilization.He was restarted on home medications of gabapentin and baclofen, and he was attempted on trial of zyprexa, but he reported that it was too sedating, so he was changed to risperdal. Dose was titrated up, but as of this AM, pt refused his dose.   Principal Problem: Paranoid schizophrenia (HCC) Diagnosis:   Patient Active Problem List   Diagnosis Date Noted  . Paranoid schizophrenia (HCC) [F20.0] 10/06/2017   Total Time spent with patient: 30 minutes  Past Psychiatric History: see  H&P  Past Medical History:  Past Medical History:  Diagnosis Date  . Anxiety   . Insomnia   . Opiate addiction (HCC)    History reviewed. No pertinent surgical history. Family History:  Family History   Problem Relation Age of Onset  . Cancer Other   . Diabetes Other    Family Psychiatric  History: see H&P Social History:  Social History   Substance and Sexual Activity  Alcohol Use No     Social History   Substance and Sexual Activity  Drug Use Yes  . Types: Marijuana   Comment: heroin    Social History   Socioeconomic History  . Marital status: Single    Spouse name: Not on file  . Number of children: Not on file  . Years of education: Not on file  . Highest education level: Not on file  Occupational History  . Not on file  Social Needs  . Financial resource strain: Not on file  . Food insecurity:    Worry: Not on file    Inability: Not on file  . Transportation needs:    Medical: Not on file    Non-medical: Not on file  Tobacco Use  . Smoking status: Never Smoker  . Smokeless tobacco: Never Used  Substance and Sexual Activity  . Alcohol use: No  . Drug use: Yes    Types: Marijuana    Comment: heroin  . Sexual activity: Not on file  Lifestyle  . Physical activity:    Days per week: Not on file    Minutes per session: Not on file  . Stress: Not on file  Relationships  . Social connections:    Talks on phone: Not on file    Gets together: Not on file    Attends religious service: Not on file  Active member of club or organization: Not on file    Attends meetings of clubs or organizations: Not on file    Relationship status: Not on file  Other Topics Concern  . Not on file  Social History Narrative  . Not on file   Additional Social History:    Pain Medications: Please see MAR Prescriptions: Please see MAR Over the Counter: Please see MAR History of alcohol / drug use?: Yes Longest period of sobriety (when/how long): Unknown Name of Substance 1: Marijuana 1 - Age of First Use: 15 1 - Amount (size/oz): 2-4 grams 1 - Frequency: Daily 1 - Duration: 2 years  1 - Last Use / Amount: 1 month ago Name of Substance 2: Opoiods 2 - Age of First  Use: Unknown 2 - Amount (size/oz): Unknown 2 - Frequency: Daily 2 - Duration: Unknown 2 - Last Use / Amount: 2015 [place]                Sleep: Poor  Appetite:  Fair  Current Medications: Current Facility-Administered Medications  Medication Dose Route Frequency Provider Last Rate Last Dose  . acetaminophen (TYLENOL) tablet 650 mg  650 mg Oral Q6H PRN Beau FannyWithrow, John C, FNP   650 mg at 10/07/17 2013  . alum & mag hydroxide-simeth (MAALOX/MYLANTA) 200-200-20 MG/5ML suspension 30 mL  30 mL Oral Q4H PRN Withrow, John C, FNP      . baclofen (LIORESAL) tablet 10 mg  10 mg Oral BID Micheal Likensainville, Christopher T, MD   10 mg at 10/11/17 0807  . DULoxetine (CYMBALTA) DR capsule 30 mg  30 mg Oral Daily Micheal Likensainville, Christopher T, MD   30 mg at 10/11/17 0806  . feeding supplement (ENSURE ENLIVE) (ENSURE ENLIVE) liquid 237 mL  237 mL Oral BID BM Micheal Likensainville, Christopher T, MD   237 mL at 10/10/17 1453  . gabapentin (NEURONTIN) capsule 600 mg  600 mg Oral BID Jolyne Loaainville, Christopher T, MD   600 mg at 10/11/17 [id number]  . haloperidol (HALDOL) tablet 2.5 mg  2.5 mg Oral BID Jolyne Loaainville, Christopher T, MD   2.5 mg at 10/11/17 0806  . hydrOXYzine (ATARAX/VISTARIL) tablet 25 mg  25 mg Oral Q6H PRN Beau FannyWithrow, John C, FNP   25 mg at 10/10/17 2012  . magnesium hydroxide (MILK OF MAGNESIA) suspension 30 mL  30 mL Oral Daily PRN Withrow, John C, FNP      . traZODone (DESYREL) tablet 50 mg  50 mg Oral QHS PRN,MR X 1 [place], Christopher T, MD   50 mg at 10/09/17 2100    Lab Results: No results found for this or any previous visit (from the past 48 hour(s)).  Blood Alcohol level:  Lab Results  [facility]   [facility]   [facility] <11 07/06/2013    Metabolic Disorder Labs: Lab Results  Component Value Date   HGBA1C 5.2 10/07/2017   MPG 102.54 10/07/2017   Lab Results  Component Value Date   PROLACTIN 6.3 10/07/2017   Lab Results  Component Value Date   CHOL 102  10/07/2017   TRIG 35 10/07/2017   HDL 44 10/07/2017   CHOLHDL 2.3 10/07/2017   VLDL 7 10/07/2017   LDLCALC 51 10/07/2017    Physical Findings: AIMS: Facial and Oral Movements Muscles of Facial Expression: None, normal Lips and Perioral Area: None, normal Jaw: None, normal Tongue: None, normal,Extremity Movements Upper (arms, wrists, hands, fingers): None, normal Lower (legs, knees, ankles, toes): None,  normal, Trunk Movements Neck, shoulders, hips: None, normal, Overall Severity Severity of abnormal movements (highest score from questions above): None, normal Incapacitation due to abnormal movements: None, normal Patient's awareness of abnormal movements (rate only patient's report): No Awareness, Dental Status Current problems with teeth and/or dentures?: No Does patient usually wear dentures?: No  CIWA:    COWS:     Musculoskeletal: Strength & Muscle Tone: within normal limits Gait & Station: normal Patient leans: N/A  Psychiatric Specialty Exam: Physical Exam  Nursing note and vitals reviewed. Constitutional: He appears well-developed.  Skin: Skin is warm.  Psychiatric: He has a normal mood and affect. His behavior is normal.    Review of Systems  Psychiatric/Behavioral: Positive for depression. Negative for hallucinations and suicidal ideas. The patient is nervous/anxious. The patient does not have insomnia.   All other systems reviewed and are negative.   Blood pressure (!) 134/96, pulse (!) 120, temperature 98.3 F (36.8 C), temperature source Oral, resp. rate 20, height 6' (1.829 m), weight 63.5 kg (140 lb), SpO2 100 %.Body mass index is 18.99 kg/m.  General Appearance: Casual and Fairly Groomed  Eye Contact:  Good  Speech:  Clear and Coherent and Normal Rate  Volume:  Normal  Mood:  Anxious and Depressed  Affect:  Blunt and Congruent  Thought Process:  Coherent and Goal Directed  Orientation:  Full (Time, Place, and Person)  Thought Content:  Ideas of  Reference:   Paranoia Delusions, Obsessions, Paranoid Ideation and Rumination  Suicidal Thoughts:  No  Homicidal Thoughts:  No  Memory:  Immediate;   Fair Recent;   Fair Remote;   Fair  Judgement:  Poor  Insight:  Lacking  Psychomotor Activity:  Normal  Concentration:  Concentration: Fair  Recall:  [employer] of Knowledge:  Fair  Language:  Fair  Akathisia:  No  Handed:    AIMS (if indicated):     Assets:  Resilience Social Support  ADL's:  Intact  Cognition:  WNL  Sleep:  Number of Hours: 6.75   Treatment Plan Summary: Daily contact with patient to assess and evaluate symptoms and progress in treatment and Medication management   -Continue with current treatment plan on 10/11/2017 as listed below except where noted  -Schizophrenia -Discontinue risperdal 1mg  po BID   -Adjusted haldol 2.5mg  to 7.5mg  QHS  -Continue duloxetine 30mg  po qDay  -Anxiety -Continue gabapentin 600mg  po BID  -Chronic pain -Continue baclofen 10mg  po BID  - Insomnia -Continue trazodone 50mg  po qhs prn insomnia  -Encourage participation in groups and therapeutic milieu -Disposition planning will be ongoing    Oneta Rack, NP 10/11/2017, 11:35 AM

## 2017-10-11 NOTE — Progress Notes (Signed)
Writer spoke with patient 1:1 and he reports that he has had a good so far. Writer informed him of scheduled medications due and he was compliant with them. He attended wrap up group and remained in the dayroom a brief time after group before deciding to lie down. He appears anxious and guarded but pleasant. Safety maintained on unit with 15 min checks.

## 2017-10-12 MED ORDER — HALOPERIDOL 5 MG PO TABS
10.0000 mg | ORAL_TABLET | Freq: Every day | ORAL | Status: DC
  Administered 2017-10-12: 10 mg via ORAL
  Filled 2017-10-12 (×2): qty 2

## 2017-10-12 NOTE — [facility] Group Notes (Signed)
[facility] Group Notes: (Clinical Social Work)   10/12/2017      Type of Therapy:  Group Therapy   Participation Level:  Did Not Attend despite MHT prompting   Nilson Tabora Grossman-Orr, LCSW 10/12/2017, 1:21 PM     

## 2017-10-12 NOTE — Plan of Care (Signed)
Patient behavior on the unit is appropriate. He is compliant with plan of care, taking medications as prescribed and showing more involvement in treatment over the past couple days.

## 2017-10-12 NOTE — [facility] Group Notes (Signed)
[facility] Group Notes:  (Nursing/MHT/Case Management/Adjunct)  Date:  10/12/2017  Time:  4:37 PM  Type of Therapy:  Psychoeducational Skills  Participation Level:  Minimal  Participation Quality:  Appropriate  Affect:  Depressed  Cognitive:  Alert  Insight:  Improving  Engagement in Group:  Limited  Modes of Intervention:  Discussion and Education  Summary of Progress/Problems: Group was on good sleep hygiene and habits. This included, setting a routine, cutting out caffeine, alcohol and nicotine, creating a comfortable environment, and only going to bed when tired.  Gregory MirzaJonathan C Jacobey Freeman 10/12/2017, 4:37 PM

## 2017-10-12 NOTE — Progress Notes (Signed)
D: Patient presents calm, cooperative. He reports he slept well last night, and did not require PRN medication for such. His appetite is fair, energy normal, and concentration good. He rated his depression and anxiety 5/10, while he rated hopelessness 3/10. He denies symptoms of withdrawal. Complains of pain 4/10 in back, neck and shoulders. Patient denies SI/HI/AVH.  A: Patient checked q15 min, and checks reviewed. Reviewed medication changes with patient and educated on side effects. Scheduled medications for chronic nerve pain were administered this morning. Educated patient on importance of attending group therapy sessions and educated on several coping skills. Encouarged participation in milieu through recreation therapy and attending meals with peers.  R: Patient receptive to education on medications, and is medication compliant. Patient attending all group therapy and cafeteria with peers. Patient contracts for safety on the unit. Goal: "to relax; ease anxiety" and to meet this "rest, color."

## 2017-10-12 NOTE — Progress Notes (Signed)
Writer spoke with patient 1:1 and he reports having had a good day. His parents visited on tonight. He is hopeful to go home on tomorrow before his birthday on [birth year].Marland Kitchen. He plans to look for another job and move on he reports. He tends to isolate to his room but during our conversations he is always pleasant and polite. Support given and safety maintained on unit with 15 min checks.

## 2017-10-12 NOTE — Progress Notes (Addendum)
[employer] MD Progress Note  10/12/2017 1:46 PM Gregory Freeman  MRN:  [id number]   Subjective:  Gregory Freeman seen resting in bed.  Continues flat and guarded but pleasant.  Patient is requesting for expected discharge date and tinme.  Continues to ruminate with coworkers and his unemployment.  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  States he is not experiencing depression at this time however feels sad that he is asked to be hospitalized for this long of a time.  Reports he followed up with a social work and he has a follow-up appointment in [place] after discharge.  Reports he is taking medications as prescribed and tolerating them well.  Continues do denies suicidal or homicidal ideations.  Denies auditory visual hallucinations.  Support and currently reassurance was provided.  History: Gregory Freeman is a 27 y/o M with history of schizophrenia who was admitted voluntarily from [facility] where he presented with his mother due to worsening symptoms of depression, paranoia, anxiety, and delusions regarding conflict with co-worker. Pt was medically cleared and then transferred to [facility] for additional treatment and stabilization.He was restarted on home medications of gabapentin and baclofen, and he was attempted on trial of zyprexa, but he reported that it was too sedating, so he was changed to risperdal. Dose was titrated up, but as of this AM, pt refused his dose.   Principal Problem: Paranoid schizophrenia (HCC) Diagnosis:   Patient Active Problem List   Diagnosis Date Noted  . Paranoid schizophrenia (HCC) [F20.0] 10/06/2017   Total Time spent with patient: 30 minutes  Past Psychiatric History: see  H&P  Past Medical History:  Past Medical History:  Diagnosis Date  . Anxiety   . Insomnia   . Opiate addiction (HCC)    History reviewed. No pertinent surgical history. Family History:  Family History  Problem Relation Age of Onset  . Cancer Other   . Diabetes Other     Family Psychiatric  History: see H&P Social History:  Social History   Substance and Sexual Activity  Alcohol Use No     Social History   Substance and Sexual Activity  Drug Use Yes  . Types: Marijuana   Comment: heroin    Social History   Socioeconomic History  . Marital status: Single    Spouse name: Not on file  . Number of children: Not on file  . Years of education: Not on file  . Highest education level: Not on file  Occupational History  . Not on file  Social Needs  . Financial resource strain: Not on file  . Food insecurity:    Worry: Not on file    Inability: Not on file  . Transportation needs:    Medical: Not on file    Non-medical: Not on file  Tobacco Use  . Smoking status: Never Smoker  . Smokeless tobacco: Never Used  Substance and Sexual Activity  . Alcohol use: No  . Drug use: Yes    Types: Marijuana    Comment: heroin  . Sexual activity: Not on file  Lifestyle  . Physical activity:    Days per week: Not on file    Minutes per session: Not on file  . Stress: Not on file  Relationships  . Social connections:    Talks on phone: Not on file    Gets together: Not on file    Attends religious service: Not on file    Active member of club or  organization: Not on file    Attends meetings of clubs or organizations: Not on file    Relationship status: Not on file  Other Topics Concern  . Not on file  Social History Narrative  . Not on file   Additional Social History:    Pain Medications: Please see MAR Prescriptions: Please see MAR Over the Counter: Please see MAR History of alcohol / drug use?: Yes Longest period of sobriety (when/how long): Unknown Name of Substance 1: Marijuana 1 - Age of First Use: 15 1 - Amount (size/oz): 2-4 grams 1 - Frequency: Daily 1 - Duration: 2 years  1 - Last Use / Amount: 1 month ago Name of Substance 2: Opoiods 2 - Age of First Use: Unknown 2 - Amount (size/oz): Unknown 2 - Frequency: Daily 2 -  Duration: Unknown 2 - Last Use / Amount: 2015 [place]                Sleep: Poor  Appetite:  Fair  Current Medications: Current Facility-Administered Medications  Medication Dose Route Frequency Provider Last Rate Last Dose  . acetaminophen (TYLENOL) tablet 650 mg  650 mg Oral Q6H PRN Beau Fanny, FNP   650 mg at 10/11/17 1252  . alum & mag hydroxide-simeth (MAALOX/MYLANTA) 200-200-20 MG/5ML suspension 30 mL  30 mL Oral Q4H PRN Withrow, John C, FNP      . baclofen (LIORESAL) tablet 10 mg  10 mg Oral BID Micheal Likens, MD   10 mg at 10/12/17 0803  . DULoxetine (CYMBALTA) DR capsule 30 mg  30 mg Oral Daily Micheal Likens, MD   30 mg at 10/12/17 0803  . feeding supplement (ENSURE ENLIVE) (ENSURE ENLIVE) liquid 237 mL  237 mL Oral BID BM Micheal Likens, MD   237 mL at 10/12/17 0803  . gabapentin (NEURONTIN) capsule 600 mg  600 mg Oral BID Jolyne Loa T, MD   600 mg at 10/12/17 1914  . haloperidol (HALDOL) tablet 7.5 mg  7.5 mg Oral QHS Oneta Rack, NP   7.5 mg at 10/11/17 2111  . hydrOXYzine (ATARAX/VISTARIL) tablet 25 mg  25 mg Oral Q6H PRN Beau Fanny, FNP   25 mg at 10/10/17 2012  . magnesium hydroxide (MILK OF MAGNESIA) suspension 30 mL  30 mL Oral Daily PRN Withrow, John C, FNP      . traZODone (DESYREL) tablet 50 mg  50 mg Oral QHS PRN,MR X 1 [place], Christopher T, MD   50 mg at 10/09/17 2100    Lab Results: No results found for this or any previous visit (from the past 48 hour(s)).  Blood Alcohol level:  Lab Results  Component Value Date   [facility] <11 08/13/2013   ETH <11 07/06/2013    Metabolic Disorder Labs: Lab Results  Component Value Date   HGBA1C 5.2 10/07/2017   MPG 102.54 10/07/2017   Lab Results  Component Value Date   PROLACTIN 6.3 10/07/2017   Lab Results  Component Value Date   CHOL 102 10/07/2017   TRIG 35 10/07/2017   HDL 44 10/07/2017   CHOLHDL 2.3 10/07/2017    VLDL 7 10/07/2017   LDLCALC 51 10/07/2017    Physical Findings: AIMS: Facial and Oral Movements Muscles of Facial Expression: None, normal Lips and Perioral Area: None, normal Jaw: None, normal Tongue: None, normal,Extremity Movements Upper (arms, wrists, hands, fingers): None, normal Lower (legs, knees, ankles, toes): None, normal, Trunk Movements Neck, shoulders,  hips: None, normal, Overall Severity Severity of abnormal movements (highest score from questions above): None, normal Incapacitation due to abnormal movements: None, normal Patient's awareness of abnormal movements (rate only patient's report): No Awareness, Dental Status Current problems with teeth and/or dentures?: No Does patient usually wear dentures?: No  CIWA:  CIWA-Ar Total: 0 COWS:  COWS Total Score: 1  Musculoskeletal: Strength & Muscle Tone: within normal limits Gait & Station: normal Patient leans: N/A  Psychiatric Specialty Exam: Physical Exam  Nursing note and vitals reviewed. Constitutional: He appears well-developed.  GI: Bowel sounds are normal.  Skin: Skin is warm.  Psychiatric: He has a normal mood and affect. His behavior is normal.    Review of Systems  Psychiatric/Behavioral: Positive for depression. Negative for hallucinations and suicidal ideas. The patient is nervous/anxious. The patient does not have insomnia.   All other systems reviewed and are negative.   Blood pressure 114/75, pulse 93, temperature 98.6 F (37 C), resp. rate 18, height 6' (1.829 m), weight 63.5 kg (140 lb), SpO2 100 %.Body mass index is 18.99 kg/m.  General Appearance: Casual and Fairly Groomed  Eye Contact:  Good  Speech:  Clear and Coherent and Normal Rate  Volume:  Normal  Mood:  Anxious and Depressed  Affect:  Blunt and Congruent  Thought Process:  Coherent and Goal Directed  Orientation:  Full (Time, Place, and Person)  Thought Content:  Ideas of Reference:   Paranoia Delusions, Obsessions, Paranoid  Ideation and Rumination  Suicidal Thoughts:  No  Homicidal Thoughts:  No  Memory:  Immediate;   Fair Recent;   Fair Remote;   Fair  Judgement:  Poor  Insight:  Lacking  Psychomotor Activity:  Normal  Concentration:  Concentration: Fair  Recall:  [employer]  Fund of Knowledge:  Fair  Language:  Fair  Akathisia:  No  Handed:    AIMS (if indicated):     Assets:  Resilience Social Support  ADL's:  Intact  Cognition:  WNL  Sleep:  Number of Hours: 6.75   Treatment Plan Summary: Daily contact with patient to assess and evaluate symptoms and progress in treatment and Medication management   -Continue with current treatment plan on 10/12/2017 as listed below except where noted  -Schizophrenia -Discontinue risperdal 1mg  po BID   -Continue Haldo 10 mg QHS  -Continue duloxetine 30mg  po qDay  -Anxiety -Continue gabapentin 600mg  po BID  -Chronic pain -Continue baclofen 10mg  po BID  - Insomnia -Continue trazodone 50mg  po qhs prn insomnia  -Encourage participation in groups and therapeutic milieu -Disposition planning will be ongoing    Oneta Rackanika N Lewis, NP 10/12/2017, 1:46 PM    ..Marland Kitchen.Agree with NP Progress Note

## 2017-10-13 MED ORDER — HALOPERIDOL DECANOATE 100 MG/ML IM SOLN
100.0000 mg | INTRAMUSCULAR | Status: DC
  Administered 2017-10-13: 100 mg via INTRAMUSCULAR
  Filled 2017-10-13 (×2): qty 1

## 2017-10-13 MED ORDER — GABAPENTIN 300 MG PO CAPS: 600.0000 mg | ORAL_CAPSULE | Freq: Two times a day (BID) | ORAL | 0 refills | Status: AC

## 2017-10-13 MED ORDER — DULOXETINE HCL 30 MG PO CPEP: 30.0000 mg | ORAL_CAPSULE | Freq: Every day | ORAL | 0 refills | Status: AC

## 2017-10-13 MED ORDER — TRAZODONE HCL 50 MG PO TABS: 50.0000 mg | ORAL_TABLET | Freq: Every evening | ORAL | 0 refills | Status: AC | PRN

## 2017-10-13 MED ORDER — HALOPERIDOL 10 MG PO TABS: 10.0000 mg | ORAL_TABLET | Freq: Every day | ORAL | 0 refills | Status: AC

## 2017-10-13 MED ORDER — BACLOFEN 10 MG PO TABS: 10.0000 mg | ORAL_TABLET | Freq: Two times a day (BID) | ORAL | 0 refills | Status: AC

## 2017-10-13 MED ORDER — HYDROXYZINE HCL 25 MG PO TABS: 25.0000 mg | ORAL_TABLET | Freq: Four times a day (QID) | ORAL | 0 refills | Status: AC | PRN

## 2017-10-13 MED ORDER — HALOPERIDOL DECANOATE 100 MG/ML IM SOLN: 100.0000 mg | INTRAMUSCULAR | 0 refills | Status: AC

## 2017-10-13 NOTE — Progress Notes (Signed)
  [facility]  Will you be returning to the same living situation after discharge:  Yes,  home At discharge, do you have transportation home?: Yes,  family Do you have the ability to pay for your medications: Yes,  insurance  Release of information consent forms completed and in the chart;  Patient's signature needed at discharge.  Patient to Follow up at: Follow-up Information    [facility] Follow up on 10/23/2017.   Specialty:  Behavioral Health Why:  Thursday at 9:00 with Dr Gregory Freeman for your hospital d/c appointment Contact information: [address] [place] Gregory Freeman [phone]          Next level of care provider [facility] Link:no  Safety Planning and Suicide Prevention discussed:  Yes  Have you used any form of tobacco in the last 30 days? (Cigarettes, Smokeless Tobacco, Cigars, and/or Pipes): No  Has patient been referred to the Quitline?: N/A patient is not a smoker  Patient has been referred for addiction treatment: N/A  Gregory RogueRodney B Marijo Quizon, LCSW 10/13/2017, 9:40 AM

## 2017-10-13 NOTE — Progress Notes (Signed)
Patient ID: Gregory Freeman, male   DOB: 03/25/1991, 27 y.o.   MRN: [id number] Patient discharged to home/self care in the presence of family.  Patient denies SI, HI and AVH and exhibits decreased paranoia,   Patient acknowledged understanding of all discharge instructions and the receipt of all belongings.

## 2017-10-13 NOTE — Plan of Care (Signed)
Pt attended one group and he fully participated.   Caroll RancherMarjette Disaya Walt, LRT/CTRS

## 2017-10-13 NOTE — [facility] Suicide Risk Assessment (Signed)
[facility] Discharge Suicide Risk Assessment   Principal Problem: Paranoid schizophrenia [facility]) Discharge Diagnoses:  Patient Active Problem List   Diagnosis Date Noted  . Paranoid schizophrenia (HCC) [F20.0] 10/06/2017    Total Time spent with patient: 30 minutes  Musculoskeletal: Strength & Muscle Tone: within normal limits Gait & Station: normal Patient leans: N/A  Psychiatric Specialty Exam: Review of Systems  Constitutional: Negative for chills and fever.  Respiratory: Negative for cough and shortness of breath.   Cardiovascular: Negative for chest pain.  Gastrointestinal: Negative for abdominal pain, heartburn, nausea and vomiting.  Psychiatric/Behavioral: Negative for depression, hallucinations and suicidal ideas. The patient is not nervous/anxious and does not have insomnia.     Blood pressure 114/75, pulse 93, temperature 98.6 F (37 C), resp. rate 18, height 6' (1.829 m), weight 63.5 kg (140 lb), SpO2 100 %.Body mass index is 18.99 kg/m.  General Appearance: Casual and Fairly Groomed  Patent attorney::  Good  Speech:  Clear and Coherent and Normal Rate  Volume:  Normal  Mood:  Euthymic  Affect:  Appropriate, Congruent and Constricted  Thought Process:  Coherent and Goal Directed  Orientation:  Full (Time, Place, and Person)  Thought Content:  Logical  Suicidal Thoughts:  No  Homicidal Thoughts:  No  Memory:  Immediate;   Fair Recent;   Fair Remote;   Fair  Judgement:  Fair  Insight:  Fair  Psychomotor Activity:  Normal  Concentration:  Good  Recall:  Good  Fund of Knowledge:Good  Language: Good  Akathisia:  No  Handed:    AIMS (if indicated):     Assets:  Communication Skills Resilience Social Support  Sleep:  Number of Hours: 6.75  Cognition: WNL  ADL's:  Intact   Mental Status Per Nursing Assessment::   On Admission:  NA  Demographic Factors:  Male, Caucasian, Low socioeconomic status and Unemployed  Loss Factors: Decrease in vocational status and  Financial problems/change in socioeconomic status  Historical Factors: Impulsivity   Risk Reduction Factors:   Living with another person, especially a relative, Positive social support, Positive therapeutic relationship and Positive coping skills or problem solving skills  Continued Clinical Symptoms:  Severe Anxiety and/or Agitation Obsessive-Compulsive Disorder  Cognitive Features That Contribute To Risk:  None    Suicide Risk:  Minimal: No identifiable suicidal ideation.  Patients presenting with no risk factors but with morbid ruminations; may be classified as minimal risk based on the severity of the depressive symptoms  Follow-up Information    [facility] Follow up on 10/23/2017.   Specialty:  Behavioral Health Why:  Thursday at 9:00 with Dr Brooke Pace for your hospital d/c appointment Contact information: [address] [place] [address] [phone]        Subjective Data:  Gregory Freeman is a 27 y/o M with history of schizophrenia who was admitted voluntarily from [facility] where he presented with his mother due to worsening symptoms of depression, paranoia, anxiety, and delusions regarding conflict with co-worker. Pt was medically cleared and then transferred to [facility] for additional treatment and stabilization.He was restarted on home medications of gabapentin and baclofen, and he was attempted on trial of zyprexa, but he reported that it was too sedating, so he was changed to risperdal, but pt later refused that medication. He was changed to haldol which he tolerated well, and dose was titrated up. Pt had improvement of his presenting symptoms.   Upon evaluation today, pt shares, "I'm doing  good." He denies any specific concerns. He is sleeping well, and he comments, "The haldol helps me sleep better." His appetite is good. He denies physical complaints. He denies SI/HI/AH/VH. In regards to his symptoms of  paranoia regarding his former workplace, pt shares, "I'm not even thinking about that any more." He reports he is tolerating his medications well, and he is in agreement to continue his current regimen without changes. Discussed with patient about long-acting injectable form of haldol decanoate, and he is in agreement to be started on a trial. [facility]. He was able to engage in safety planning [facility] or contact emergency services if he feels unable to maintain his own safety or the safety of others. Pt had no further questions, comments, or concerns.   Plan Of Care/Follow-up recommendations:   -Discharge to outpatient level of care  -Schizophrenia -Continue haldol 10mg  po qhs   -Start haldol decanoate 100mg  IM q30 days (administer today 10/13/17) -Continueduloxetine 30mg  po qDay  -Anxiety -Continue gabapentin 600mg  po BID   -Continue vistaril 25mg  po q6h prn anxiety  -Chronic pain -Continue baclofen 10mg  po BID  - Insomnia -Continue trazodone 50mg  po qhs prn insomnia  Activity:  as tolerated Diet:  normal Tests:  NA Other:  see above for DC plan  Gregory Likenshristopher T Cheridan Kibler, MD 10/13/2017, 9:55 AM

## 2017-10-13 NOTE — Progress Notes (Signed)
Recreation Therapy Notes  INPATIENT RECREATION TR PLAN  Patient Details Name: Gregory Freeman MRN: [id number] DOB: 1990/10/10 Today's Date: 10/13/2017  Rec Therapy Plan Is patient appropriate for Therapeutic Recreation?: Yes Treatment times per week: about 3 days Estimated Length of Stay: 5-7 days TR Treatment/Interventions: Group participation (Comment)  Discharge Criteria Pt will be discharged from therapy if:: Discharged Treatment plan/goals/alternatives discussed and agreed upon by:: Patient/family  Discharge Summary Short term goals set: See care plan Short term goals met: Adequate for discharge Progress toward goals comments: Groups attended Which groups?: Wellness Reason goals not met: Pt rarely came to groups. Therapeutic equipment acquired: None Reason patient discharged from therapy: Discharge from hospital Pt/family agrees with progress & goals achieved: Yes Date patient discharged from therapy: 10/13/17     Victorino Sparrow, LRT/CTRS   Ria Comment, Beatriz Quintela A 10/13/2017, 12:08 PM

## 2017-10-13 NOTE — Progress Notes (Signed)
Recreation Therapy Notes  Date: 6.3.19 Time: 1000 Location: 500 Hall Dayroom  Group Topic: Coping Skills  Goal Area(s) Addresses:  Patient will be able to identify positive coping skills. Patient will be able to identify benefits of using coping skills post d/c.  Intervention: Worksheet, pencils  Activity: Mind map.  LRT and patients filled in the first 8 boxes of the mind map together (guilt, disappointment, sadness, anger, physical harm, depression, anxiety and financial problems).  Patients were to then come up with 3 coping skills for each problem individually before reconvening as a group.  LRT would then fill in the coping skills on the board.   Education: Coping Skills, Discharge Planning.   Education Outcome: Acknowledges understanding/In group clarification offered/Needs additional education.   Clinical Observations/Feedback: Pt did not attend group.     Celia Gibbons, LRT/CTRS         Torence Palmeri A 10/13/2017 12:03 PM 

## 2017-10-13 NOTE — Tx Team (Signed)
Interdisciplinary Treatment and Diagnostic Plan Update  10/13/2017 Time of Session: 8:44 AM  Trai Ells MRN: [id number]  Principal Diagnosis: Paranoid schizophrenia [facility])  Secondary Diagnoses: Principal Problem:   Paranoid schizophrenia ([place])   Current Medications:  Current Facility-Administered Medications  Medication Dose Route Frequency Provider Last Rate Last Dose  . acetaminophen (TYLENOL) tablet 650 mg  650 mg Oral Q6H PRN Benjamine Mola, FNP   650 mg at 10/11/17 1252  . alum & mag hydroxide-simeth (MAALOX/MYLANTA) 200-200-20 MG/5ML suspension 30 mL  30 mL Oral Q4H PRN Withrow, John C, FNP      . baclofen (LIORESAL) tablet 10 mg  10 mg Oral BID Pennelope Bracken, MD   10 mg at 10/13/17 0756  . DULoxetine (CYMBALTA) DR capsule 30 mg  30 mg Oral Daily Pennelope Bracken, MD   30 mg at 10/13/17 0756  . feeding supplement (ENSURE ENLIVE) (ENSURE ENLIVE) liquid 237 mL  237 mL Oral BID BM Pennelope Bracken, MD   237 mL at 10/12/17 0803  . gabapentin (NEURONTIN) capsule 600 mg  600 mg Oral BID Maris Berger T, MD   600 mg at 10/13/17 0756  . haloperidol (HALDOL) tablet 10 mg  10 mg Oral QHS Derrill [place], NP   10 mg at 10/12/17 2057  . hydrOXYzine (ATARAX/VISTARIL) tablet 25 mg  25 mg Oral Q6H PRN Benjamine Mola, FNP   25 mg at 10/12/17 2059  . magnesium hydroxide (MILK OF MAGNESIA) suspension 30 mL  30 mL Oral Daily PRN Withrow, John C, FNP      . traZODone (DESYREL) tablet 50 mg  50 mg Oral QHS PRN,MR X 1 [place], Randa Ngo, MD   50 mg at 10/09/17 2100    PTA Medications: Medications Prior to Admission  Medication Sig Dispense Refill Last Dose  . baclofen (LIORESAL) 10 MG tablet Take 10 mg by mouth 2 (two) times daily.  1 10/06/2017  . diazepam (VALIUM) 10 MG tablet Take 10 mg by mouth 3 (three) times daily as needed for anxiety.    last year  . gabapentin (NEURONTIN) 600 MG tablet Take 600 mg by mouth 2 (two) times daily.   10/06/2017   . hydrOXYzine (VISTARIL) 25 MG capsule Take 25 mg by mouth 3 (three) times daily as needed for anxiety.    10/05/2017  . methocarbamol (ROBAXIN) 500 MG tablet Take 500 mg by mouth every 8 (eight) hours as needed for muscle spasms.   0 Past Month  . Omega-3 Fatty Acids (FISH OIL PO) Take 1 capsule by mouth daily.   10/06/2017  . tiZANidine (ZANAFLEX) 4 MG tablet Take 4 mg by mouth at bedtime as needed for muscle spasms.    unknown    Patient Stressors: Financial difficulties Health problems  Patient Strengths: Ability for insight Average or above average intelligence Communication skills Supportive family/friends  Treatment Modalities: Medication Management, Group therapy, Case management,  1 to 1 session with clinician, Psychoeducation, Recreational therapy.   Physician Treatment Plan for Primary Diagnosis: Paranoid schizophrenia ([place]) Long Term Goal(s): Improvement in symptoms so as ready for discharge  Short Term Goals: Ability to identify and develop effective coping behaviors will improve Ability to identify triggers associated with substance abuse/mental health issues will improve  Medication Management: Evaluate patient's response, side effects, and tolerance of medication regimen.  Therapeutic Interventions: 1 to 1 sessions, Unit Group sessions and Medication administration.  Evaluation of Outcomes: Adequate for Discharge  Physician Treatment Plan for Secondary Diagnosis: Principal  Problem:   Paranoid schizophrenia ([place])   Long Term Goal(s): Improvement in symptoms so as ready for discharge  Short Term Goals: Ability to identify and develop effective coping behaviors will improve Ability to identify triggers associated with substance abuse/mental health issues will improve  Medication Management: Evaluate patient's response, side effects, and tolerance of medication regimen.  Therapeutic Interventions: 1 to 1 sessions, Unit Group sessions and Medication  administration.  Evaluation of Outcomes: Adequate for Discharge   RN Treatment Plan for Primary Diagnosis: Paranoid schizophrenia ([place]) Long Term Goal(s): Knowledge of disease and therapeutic regimen to maintain health will improve  Short Term Goals: Ability to identify and develop effective coping behaviors will improve and Compliance with prescribed medications will improve  Medication Management: RN will administer medications as ordered by provider, will assess and evaluate patient's response and provide education to patient for prescribed medication. RN will report any adverse and/or side effects to prescribing provider.  Therapeutic Interventions: 1 on 1 counseling sessions, Psychoeducation, Medication administration, Evaluate responses to treatment, Monitor vital signs and CBGs as ordered, Perform/monitor CIWA, COWS, AIMS and Fall Risk screenings as ordered, Perform wound care treatments as ordered.  Evaluation of Outcomes: Adequate for Discharge   LCSW Treatment Plan for Primary Diagnosis: Paranoid schizophrenia ([place]) Long Term Goal(s): Safe transition to appropriate next level of care at discharge, Engage patient in therapeutic group addressing interpersonal concerns.  Short Term Goals: Engage patient in aftercare planning with referrals and resources  Therapeutic Interventions: Assess for all discharge needs, 1 to 1 time with Social worker, Explore available resources and support systems, Assess for adequacy in community support network, Educate family and significant other(s) on suicide prevention, Complete Psychosocial Assessment, Interpersonal group therapy.  Evaluation of Outcomes: Met  Return home, follow up current provider   Progress in Treatment: Attending groups: Yes Participating in groups: Yes Taking medication as prescribed: Yes Toleration medication: Yes, no side effects reported at this time Family/Significant other contact made: Yes Patient understands  diagnosis: Yes AEB asking for help with anxiety Discussing patient identified problems/goals with staff: Yes Medical problems stabilized or resolved: Yes Denies suicidal/homicidal ideation: Yes Issues/concerns per patient self-inventory: None Other: N/A  New problem(s) identified: None identified at this time.   New Short Term/Long Term Goal(s): "I want to learn to calm myself down internally."   Discharge Plan or Barriers:   Reason for Continuation of Hospitalization:    Estimated Length of Stay: D/C today  Attendees: Patient:  10/13/2017  8:44 AM  Physician: Maris Berger, MD 10/13/2017  8:44 AM  Nursing: Phillis Haggis, RN 10/13/2017  8:44 AM  RN Care Manager: Lars Pinks, RN 10/13/2017  8:44 AM  Social Worker: Ripley Fraise 10/13/2017  8:44 AM  Recreational Therapist: Winfield Cunas 10/13/2017  8:44 AM  Other: Norberto Sorenson 10/13/2017  8:44 AM  Other:  10/13/2017  8:44 AM    Scribe for Treatment Team:  Roque Lias LCSW 10/13/2017 8:44 AM

## 2017-10-13 NOTE — Discharge Summary (Addendum)
Physician Discharge Summary Note  Patient:  Gregory Freeman is an 27 y.o., male  MRN:  [id number]  DOB:  05-12-1991  Patient phone:  [phone] (home)   Patient address:   [address] [place] [address],   Total Time spent with patient: Greater than 30 minutes  Date of Admission:  10/06/2017  Date of Discharge: 056-03-19  Reason for Admission: Worsening symptoms of depression, paranoia, anxiety, and delusions regarding conflict with co-worker.  Principal Problem: Paranoid schizophrenia [facility])  Discharge Diagnoses: Patient Active Problem List   Diagnosis Date Noted  . Paranoid schizophrenia (HCC) [F20.0] 10/06/2017   Past Psychiatric History: Paranoid Schizophrenia.  Past Medical History:  Past Medical History:  Diagnosis Date  . Anxiety   . Insomnia   . Opiate addiction (HCC)    History reviewed. No pertinent surgical history.  Family History:  Family History  Problem Relation Age of Onset  . Cancer Other   . Diabetes Other    Family Psychiatric  History: See H&P.  Social History:  Social History   Substance and Sexual Activity  Alcohol Use No     Social History   Substance and Sexual Activity  Drug Use Yes  . Types: Marijuana   Comment: heroin    Social History   Socioeconomic History  . Marital status: Single    Spouse name: Not on file  . Number of children: Not on file  . Years of education: Not on file  . Highest education level: Not on file  Occupational History  . Not on file  Social Needs  . Financial resource strain: Not on file  . Food insecurity:    Worry: Not on file    Inability: Not on file  . Transportation needs:    Medical: Not on file    Non-medical: Not on file  Tobacco Use  . Smoking status: Never Smoker  . Smokeless tobacco: Never Used  Substance and Sexual Activity  . Alcohol use: No  . Drug use: Yes    Types: Marijuana    Comment: heroin  . Sexual activity: Not on file  Lifestyle  .  Physical activity:    Days per week: Not on file    Minutes per session: Not on file  . Stress: Not on file  Relationships  . Social connections:    Talks on phone: Not on file    Gets together: Not on file    Attends religious service: Not on file    Active member of club or organization: Not on file    Attends meetings of clubs or organizations: Not on file    Relationship status: Not on file  Other Topics Concern  . Not on file  Social History Narrative  . Not on file   Hospital Course: (Per Md's discharge SRA):  Gregory Freeman is a 27 y/o M with history of schizophrenia who was [facility] from [facility] where he presented with his mother due to worsening symptoms of depression, paranoia, anxiety, and delusions regarding conflict with co-worker. Pt was medically cleared and then transferred to [facility] for additional treatment and stabilization.He was restarted on home medications of gabapentin and baclofen, and he was attempted on trial of zyprexa, but he reported that it was too sedating, so he was changed to risperdal, but pt later refused that medication. He was changed to haldol which he tolerated well, and dose was titrated up. Pt had improvement of his presenting symptoms.   Besides the use of  Gabapentin 300 mg for agitation, Haldol 10 mg po & Haldol Decanoate 100 mg/ml both for mood control respectively, Gregory Freeman was also medicated & discharged on; Duloxetine 30 mg for depression, Hydroxyzine 25 mg prn for anxiety & Trazodone 50 mg prn for insomnia. He was resumed & discharged on all his pertinent home medications for the other medical issues presented. He tolerated his treatment regimen without any adverse effects or reactions reported. Gregory Freeman was enrolled in the group counseling sessions being offered & held on this unit. He seldom participated citing that he is an introvert who likes to keep by & to himself.   Upon his discharge evaluation today, pt shares, "I'm doing good." He  denies any specific concerns. He is sleeping well, and he comments, "The haldol helps me sleep better." His appetite is good. He denies physical complaints. He denies SI/HI/AH/VH. In regards to his symptoms of paranoia regarding his former workplace, pt shares, "I'm not even thinking about that any more." He reports he is tolerating his medications well, and he is in agreement to continue his current regimen without changes. Discussed with patient about long-acting injectable form of haldol decanoate, and he is in agreement to be started on a trial. Pt is in agreement to follow up at [facility]. He was able to engage in safety planning including plan to return to [facility] or contact emergency services if he feels unable to maintain his own safety or the safety of others. Pt had no further questions, comments, or concerns.  Upon discharge, Gregory Freeman presents mentally & medically stable. He will continue mental health care on outpatient basis as noted below. He is provided with all the necessary information needed to make this appointment without problems. He left [facility] with all personal belongings in no apparent distress. Transportation per family.  Physical Findings:  AIMS: Facial and Oral Movements Muscles of Facial Expression: None, normal Lips and Perioral Area: None, normal Jaw: None, normal Tongue: None, normal,Extremity Movements Upper (arms, wrists, hands, fingers): None, normal Lower (legs, knees, ankles, toes): None, normal, Trunk Movements Neck, shoulders, hips: None, normal, Overall Severity Severity of abnormal movements (highest score from questions above): None, normal Incapacitation due to abnormal movements: None, normal Patient's awareness of abnormal movements (rate only patient's report): No Awareness, Dental Status Current problems with teeth and/or dentures?: No Does patient usually wear dentures?: No  CIWA:  CIWA-Ar Total: 0 COWS:  COWS Total  Score: 1  Musculoskeletal: Strength & Muscle Tone: within normal limits Gait & Station: normal Patient leans: N/A  Psychiatric Specialty Exam: Physical Exam  Nursing note and vitals reviewed. Constitutional: He appears well-developed.  HENT:  Head: Normocephalic.  Eyes: Pupils are equal, round, and reactive to light.  Neck: Normal range of motion.  Cardiovascular: Normal rate.  Respiratory: Effort normal.  GI: Soft.  Genitourinary:  Genitourinary Comments: Deferred  Musculoskeletal: Normal range of motion.  Neurological: He is alert.  Skin: Skin is warm.    Review of Systems  Constitutional: Negative.   HENT: Negative.   Eyes: Negative.   Respiratory: Negative.   Cardiovascular: Negative.   Gastrointestinal: Negative.   Genitourinary: Negative.   Musculoskeletal: Negative.   Skin: Negative.   Neurological: Negative.   Endo/Heme/Allergies: Negative.   Psychiatric/Behavioral: Positive for depression (Stable) and hallucinations (Hx. Psychosis). Negative for memory loss and suicidal ideas. The patient has insomnia (Stable). The patient is not nervous/anxious.     Blood pressure 114/75, pulse 93, temperature 98.6 F (37 C), resp. rate  18, height 6' (1.829 m), weight 63.5 kg (140 lb), SpO2 100 %.Body mass index is 18.99 kg/m.  See Md's SRA   Have you used any form of tobacco in the last 30 days? (Cigarettes, Smokeless Tobacco, Cigars, and/or Pipes): No  Has this patient used any form of tobacco in the last 30 days? (Cigarettes, Smokeless Tobacco, Cigars, and/or Pipes): N/A  Blood Alcohol level:  Lab Results  Component Value Date   [facility] 08/13/2013   ETH <11 07/06/2013   Metabolic Disorder Labs:  Lab Results  Component Value Date   HGBA1C 5.2 10/07/2017   MPG 102.54 10/07/2017   Lab Results  Component Value Date   PROLACTIN 6.3 10/07/2017   Lab Results  Component Value Date   CHOL 102 10/07/2017   TRIG 35 10/07/2017   HDL 44 10/07/2017   CHOLHDL 2.3  10/07/2017   VLDL 7 10/07/2017   LDLCALC 51 10/07/2017   See Psychiatric Specialty Exam and Suicide Risk Assessment completed by Attending Physician prior to discharge.  Discharge destination:  Home  Is patient on multiple antipsychotic therapies at discharge:  No   Has Patient had three or more failed trials of antipsychotic monotherapy by history:  No  Recommended Plan for Multiple Antipsychotic Therapies: NA  Allergies as of 10/13/2017      Reactions   Serotonin Reuptake Inhibitors (ssris) Other (See Comments)   Suicidal behavior   Penicillins Rash, Other (See Comments)   Has patient had a PCN reaction causing immediate rash, facial/tongue/throat swelling, SOB or lightheadedness with hypotension: yes Has patient had a PCN reaction causing severe rash involving mucus membranes or skin necrosis: no Has patient had a PCN reaction that required hospitalization: no Has patient had a PCN reaction occurring within the last 10 years: yes - 2 yrs ago If all of the above answers are "NO", then may proceed with Cephalosporin use.      Medication List    STOP taking these medications   diazepam 10 MG tablet Commonly known as:  VALIUM   FISH OIL PO   gabapentin 600 MG tablet Commonly known as:  NEURONTIN Replaced by:  gabapentin 300 MG capsule   hydrOXYzine 25 MG capsule Commonly known as:  VISTARIL   methocarbamol 500 MG tablet Commonly known as:  ROBAXIN   ZANAFLEX 4 MG tablet Generic drug:  tiZANidine     TAKE these medications     Indication  baclofen 10 MG tablet Commonly known as:  LIORESAL Take 1 tablet (10 mg total) by mouth 2 (two) times daily. For muscle spasms What changed:  additional instructions  Indication:  Muscle Spasticity   DULoxetine 30 MG capsule Commonly known as:  CYMBALTA Take 1 capsule (30 mg total) by mouth daily. For depression Start taking on:  10/14/2017  Indication:  Major Depressive Disorder   gabapentin 300 MG capsule Commonly known as:   NEURONTIN Take 2 capsules (600 mg total) by mouth 2 (two) times daily. For agitation Replaces:  gabapentin 600 MG tablet  Indication:  Agitation   haloperidol 10 MG tablet Commonly known as:  HALDOL Take 1 tablet (10 mg total) by mouth at bedtime. For mood control  Indication:  Mood control   haloperidol decanoate 100 MG/ML injection Commonly known as:  HALDOL DECANOATE Inject 1 mL (100 mg total) into the muscle every 30 (thirty) days. (Due ob 11-12-17): For mood control Start taking on:  11/12/2017  Indication:  Mood control   hydrOXYzine 25 MG tablet Commonly known as:  ATARAX/VISTARIL Take 1 tablet (25 mg total) by mouth every 6 (six) hours as needed for anxiety.  Indication:  Feeling Anxious   traZODone 50 MG tablet Commonly known as:  DESYREL Take 1 tablet (50 mg total) by mouth at bedtime as needed for sleep.  Indication:  Trouble Sleeping      Follow-up Information    [facility] Follow up on 10/23/2017.   Specialty:  Behavioral Health Why:  Thursday at 9:00 with Dr Brooke Pace for your hospital d/c appointment Contact information: [address] [place] [address] [phone]         Follow-up recommendations: Activity:  As tolerated Diet: As recommended by your primary care doctor. Keep all scheduled follow-up appointments as recommended.   Comments: Patient is instructed prior to discharge to: Take all medications as prescribed by his/her mental healthcare provider. Report any adverse effects and or reactions from the medicines to his/her outpatient provider promptly. Patient has been instructed & cautioned: To not engage in alcohol and or illegal drug use while on prescription medicines. In the event of worsening symptoms, patient is instructed to call the crisis hotline, 911 and or go to the nearest ED for appropriate evaluation and treatment of symptoms. To follow-up with his/her primary care  provider for your other medical issues, concerns and or health care needs.   Signed: Armandina Stammer, NP, PMHNP, FNP-BC 10/13/2017, 11:08 AM   Patient seen, Suicide Assessment Completed.  Disposition Plan Reviewed

## 2017-10-23 ENCOUNTER — Ambulatory Visit (HOSPITAL_COMMUNITY): Payer: Self-pay | Admitting: Psychiatry

## 2019-01-05 ENCOUNTER — Other Ambulatory Visit: Payer: Self-pay

## 2019-01-05 ENCOUNTER — Emergency Department
Admission: EM | Admit: 2019-01-05 | Discharge: 2019-01-05 | Disposition: A | Discharge status: Home / Self Care | Payer: BC Managed Care – PPO | Source: Home / Self Care | Attending: Family Medicine | Admitting: Family Medicine

## 2019-01-05 DIAGNOSIS — R3911 Hesitancy of micturition: Secondary | ICD-10-CM | POA: Diagnosis not present

## 2019-01-05 LAB — POCT URINALYSIS DIP (MANUAL ENTRY)
Bilirubin, UA: NEGATIVE
Blood, UA: NEGATIVE
Glucose, UA: NEGATIVE mg/dL
Ketones, POC UA: NEGATIVE mg/dL
Leukocytes, UA: NEGATIVE
Nitrite, UA: NEGATIVE
Protein Ur, POC: NEGATIVE mg/dL
Spec Grav, UA: 1.015 (ref 1.010–1.025)
Urobilinogen, UA: 0.2 E.U./dL — AB
pH, UA: 6 (ref 5.0–8.0)

## 2019-01-05 NOTE — ED Triage Notes (Signed)
Pt states that he has had an ungoing UTI for several year.  Burning and frequency, difficulty keeping the flow going.  No concerns for STDS.

## 2019-01-05 NOTE — ED Provider Notes (Signed)
Gregory Freeman [facility]    CSN: [id number] Arrival date & time: 01/05/19  1653      History   Chief Complaint Chief Complaint  Patient presents with  . Urinary Frequency  . Dysuria    HPI Gregory Freeman is a 28 y.o. male.   Patient complains of urinary hesitancy for about two years with decreased urinary stream and mild dysuria/frequency.  He denies urethral discharge and has no concerns for STD.  He denies rash, testicular swelling/pain or scrotal pain.  He feels well otherwise. He suggests that his symptoms may be a result of anxiety.  The history is provided by the patient.  Urinary Frequency This is a chronic problem. Episode onset: 2 years ago. The problem occurs constantly. The problem has not changed since onset.Pertinent negatives include no abdominal pain. Nothing aggravates the symptoms. Nothing relieves the symptoms. He has tried nothing for the symptoms.    Past Medical History:  Diagnosis Date  . Anxiety   . Insomnia   . Opiate addiction [facility])     Patient Active Problem List   Diagnosis Date Noted  . Paranoid schizophrenia ([place]) 10/06/2017    History reviewed. No pertinent surgical history.     Home Medications    Prior to Admission medications   Medication Sig Start Date End Date Taking? Authorizing Provider  baclofen (LIORESAL) 10 MG tablet Take 1 tablet (10 mg total) by mouth 2 (two) times daily. For muscle spasms 10/13/17   Lindell Spar I, NP  DULoxetine (CYMBALTA) 30 MG capsule Take 1 capsule (30 mg total) by mouth daily. For depression 10/14/17   Lindell Spar I, NP  gabapentin (NEURONTIN) 300 MG capsule Take 2 capsules (600 mg total) by mouth 2 (two) times daily. For agitation 10/13/17   Lindell Spar I, NP  haloperidol (HALDOL) 10 MG tablet Take 1 tablet (10 mg total) by mouth at bedtime. For mood control 10/13/17   Lindell Spar I, NP  haloperidol decanoate (HALDOL DECANOATE) 100 MG/ML injection Inject 1 mL (100 mg total) into the muscle every  30 (thirty) days. (Due ob 11-12-17): For mood control 11/12/17   Lindell Spar I, NP  hydrOXYzine (ATARAX/VISTARIL) 25 MG tablet Take 1 tablet (25 mg total) by mouth every 6 (six) hours as needed for anxiety. 10/13/17   Lindell Spar I, NP  traZODone (DESYREL) 50 MG tablet Take 1 tablet (50 mg total) by mouth at bedtime as needed for sleep. 10/13/17   Encarnacion Slates, NP    Family History Family History  Problem Relation Age of Onset  . Cancer Other   . Diabetes Other     Social History Social History   Tobacco Use  . Smoking status: Light Tobacco Smoker    Types: Cigarettes  . Smokeless tobacco: Never Used  Substance Use Topics  . Alcohol use: No  . Drug use: Not Currently    Comment: heroin     Allergies   Serotonin reuptake inhibitors (ssris) and Penicillins   Review of Systems Review of Systems  Constitutional: Negative.   HENT: Negative.   Eyes: Negative.   Respiratory: Negative.   Cardiovascular: Negative.   Gastrointestinal: Negative.  Negative for abdominal pain.  Genitourinary: Positive for decreased urine volume, difficulty urinating, dysuria, frequency and urgency. Negative for discharge, enuresis, flank pain, genital sores, hematuria, penile pain, penile swelling, scrotal swelling and testicular pain.  Musculoskeletal: Negative.   Neurological: Negative.      Physical Exam Triage Vital Signs ED Triage Vitals  Enc  Vitals Group     BP 01/05/19 1713 128/79     Pulse Rate 01/05/19 1713 95     Resp 01/05/19 1713 20     Temp 01/05/19 1713 98 F (36.7 C)     Temp Source 01/05/19 1713 Oral     SpO2 01/05/19 1713 97 %     Weight 01/05/19 1714 175 lb (79.4 kg)     Height 01/05/19 1714 6' (1.829 m)     Head Circumference --      Peak Flow --      Pain Score 01/05/19 1714 7     Pain Loc --      Pain Edu? --      Excl. in GC? --    No data found.  Updated Vital Signs BP 128/79 (BP Location: Right Arm)   Pulse 95   Temp 98 F (36.7 C) (Oral)   Resp 20   Ht  6' (1.829 m)   Wt 79.4 kg   SpO2 97%   BMI 23.73 kg/m   Visual Acuity Right Eye Distance:   Left Eye Distance:   Bilateral Distance:    Right Eye Near:   Left Eye Near:    Bilateral Near:     Physical Exam Nursing notes and Vital Signs reviewed. Appearance:  Patient appears stated age, and in no acute distress.    Eyes:  Pupils are equal, round, and reactive to light and accomodation.  Extraocular movement is intact.  Conjunctivae are not inflamed   Pharynx:  Normal; moist mucous membranes  Neck:  Supple.  No adenopathy Lungs:  Clear to auscultation.  Breath sounds are equal.  Moving air well. Heart:  Regular rate and rhythm without murmurs, rubs, or gallops.  Abdomen:  Nontender without masses or hepatosplenomegaly.  Bowel sounds are present.  No CVA or flank tenderness.  Genitourinary:  Penis normal without lesions or urethral discharge.  Scrotum is normal.  Testes are descended bilaterally without nodules or tenderness.  No epididymus tenderness.  No hernias are palpated.  No regional lymphadenopathy palpated. Rectal Exam:  Anus is normal without surrounding erythema.  No external hemorrhoids are present.  No lesions are palpated in the rectal vault. Prostate is not enlarged, and symmetric without tenderness or nodules.  Extremities:  No edema.  Skin:  No rash present.     UC Treatments / Results  Labs (all labs ordered are listed, but only abnormal results are displayed) Labs Reviewed  POCT URINALYSIS DIP (MANUAL ENTRY) - Abnormal; Notable for the following components:      Result Value   Urobilinogen, UA 0.2 (*)    All other components within normal limits  BASIC METABOLIC PANEL  PSA    EKG   Radiology No results found.  Procedures Procedures (including critical care time)  Medications Ordered in UC Medications - No data to display  Initial Impression / Assessment and Plan / UC Course  I have reviewed the triage vital signs and the nursing notes.   Pertinent labs & imaging results that were available during my care of the patient were reviewed by me and considered in my medical decision making (see chart for details).    Normal exam reassuring.  No evidence UTI Patient reports that he is now taking doxepin, which has a potential to cause urinary symptoms. Will check PSA, and BMP. Recommend evaluation by urologist.  Final Clinical Impressions(s) / UC Diagnoses   Final diagnoses:  Urinary hesitancy   Discharge Instructions  None    ED Prescriptions    None         Lattie Haw, MD 01/06/19 1129

## 2019-01-06 LAB — BASIC METABOLIC PANEL
BUN/Creatinine Ratio: 5 (calc) — ABNORMAL LOW (ref 6–22)
BUN: 4 mg/dL — ABNORMAL LOW (ref 7–25)
CO2: 28 mmol/L (ref 20–32)
Calcium: 9.5 mg/dL (ref 8.6–10.3)
Chloride: 101 mmol/L (ref 98–110)
Creat: 0.85 mg/dL (ref 0.60–1.35)
Glucose, Bld: 85 mg/dL (ref 65–99)
Potassium: 3.7 mmol/L (ref 3.5–5.3)
Sodium: 138 mmol/L (ref 135–146)

## 2019-01-06 LAB — PSA: PSA: 0.5 ng/mL (ref <=4.0)

## 2019-01-08 ENCOUNTER — Telehealth (HOSPITAL_COMMUNITY): Payer: Self-pay | Admitting: Emergency Medicine

## 2019-01-08 NOTE — Telephone Encounter (Signed)
Negative labs, Attempted to reach patient. No answer at this time.
